# Patient Record
Sex: Female | Born: 1935 | Hispanic: Yes | Marital: Married | Smoking: Never smoker
Health system: Southern US, Community
[De-identification: ages and names within clinical notes are randomized; demographics above are authoritative.]

## PROBLEM LIST (undated history)

## (undated) DIAGNOSIS — E785 Hyperlipidemia, unspecified: Secondary | ICD-10-CM

## (undated) DIAGNOSIS — I10 Essential (primary) hypertension: Secondary | ICD-10-CM

---

## 2005-07-20 ENCOUNTER — Ambulatory Visit: Payer: Self-pay

## 2006-10-24 ENCOUNTER — Ambulatory Visit: Payer: Self-pay | Admitting: Unknown Physician Specialty

## 2007-11-13 ENCOUNTER — Ambulatory Visit: Payer: Self-pay | Admitting: Family Medicine

## 2008-11-11 ENCOUNTER — Ambulatory Visit: Payer: Self-pay | Admitting: Family Medicine

## 2010-01-05 ENCOUNTER — Ambulatory Visit: Payer: Self-pay | Admitting: Family Medicine

## 2013-10-20 ENCOUNTER — Ambulatory Visit: Payer: Self-pay | Admitting: Urology

## 2013-11-09 ENCOUNTER — Ambulatory Visit: Payer: Self-pay | Admitting: Family Medicine

## 2014-04-27 ENCOUNTER — Encounter (HOSPITAL_COMMUNITY): Payer: Self-pay | Admitting: Emergency Medicine

## 2014-04-27 ENCOUNTER — Inpatient Hospital Stay (HOSPITAL_COMMUNITY)
Admission: EM | Admit: 2014-04-27 | Discharge: 2014-05-01 | DRG: 418 | Disposition: A | Discharge status: Home / Self Care | Payer: Medicaid Other | Attending: Internal Medicine | Admitting: Internal Medicine

## 2014-04-27 ENCOUNTER — Emergency Department (HOSPITAL_COMMUNITY): Payer: Medicaid Other

## 2014-04-27 DIAGNOSIS — K8060 Calculus of gallbladder and bile duct with cholecystitis, unspecified, without obstruction: Secondary | ICD-10-CM | POA: Diagnosis present

## 2014-04-27 DIAGNOSIS — E785 Hyperlipidemia, unspecified: Secondary | ICD-10-CM | POA: Diagnosis present

## 2014-04-27 DIAGNOSIS — K851 Biliary acute pancreatitis without necrosis or infection: Secondary | ICD-10-CM

## 2014-04-27 DIAGNOSIS — M129 Arthropathy, unspecified: Secondary | ICD-10-CM | POA: Diagnosis present

## 2014-04-27 DIAGNOSIS — R112 Nausea with vomiting, unspecified: Secondary | ICD-10-CM

## 2014-04-27 DIAGNOSIS — K810 Acute cholecystitis: Secondary | ICD-10-CM

## 2014-04-27 DIAGNOSIS — K859 Acute pancreatitis without necrosis or infection, unspecified: Principal | ICD-10-CM | POA: Diagnosis present

## 2014-04-27 DIAGNOSIS — I10 Essential (primary) hypertension: Secondary | ICD-10-CM | POA: Diagnosis present

## 2014-04-27 DIAGNOSIS — Z79899 Other long term (current) drug therapy: Secondary | ICD-10-CM

## 2014-04-27 DIAGNOSIS — D649 Anemia, unspecified: Secondary | ICD-10-CM | POA: Diagnosis present

## 2014-04-27 DIAGNOSIS — R1013 Epigastric pain: Secondary | ICD-10-CM

## 2014-04-27 DIAGNOSIS — K8064 Calculus of gallbladder and bile duct with chronic cholecystitis without obstruction: Secondary | ICD-10-CM | POA: Diagnosis present

## 2014-04-27 HISTORY — DX: Hyperlipidemia, unspecified: E78.5

## 2014-04-27 HISTORY — DX: Essential (primary) hypertension: I10

## 2014-04-27 MED ORDER — GI COCKTAIL ~~LOC~~
30.0000 mL | Freq: Once | ORAL | Status: AC
  Administered 2014-04-27: 30 mL via ORAL
  Filled 2014-04-27: qty 30

## 2014-04-27 NOTE — ED Notes (Signed)
Pt presents with LUQ pain since 1600. Vomited at home times 4, denies blood in vomit, denies diarrhea. Hx of GERD and gastric ulcers.

## 2014-04-27 NOTE — ED Notes (Signed)
Family at bedside. Patient does not speak [place]. Family member at bedside to translate. Patient states that she does not know her height or weight.

## 2014-04-27 NOTE — ED Provider Notes (Signed)
CSN: [id number]     Arrival date & time 04/27/14  2203 History  This chart was scribed for Vida Roller, MD by Milly Jakob, ED Scribe. The patient was seen in room APA12/APA12. Patient's care was started at 11:27 PM.    Chief Complaint  Patient presents with  . Abdominal Pain   The history is provided by the patient. The history is limited by a language barrier. A language interpreter was used (A relative).   HPI Comments: Caroline Maldonado is a 80 y.o. female with a history of HTN who presents to the Emergency Department complaining of acute onset, epigastric, abdominal pain that began 8 hours ago and radiates into her back. She reports that she has pain in her chest when she eats spicy food.  She reports associated cough and nausea. She reports dysuria. She denies She does not drink alcohol. She denies prior surgery. She has taken an NSAID regularly for the past 2 years for arthritis.   Past Medical History  Diagnosis Date  . Hypertension   . Hyperlipidemia    History reviewed. No pertinent past surgical history. History reviewed. No pertinent family history. History  Substance Use Topics  . Smoking status: Never Smoker   . Smokeless tobacco: Not on file  . Alcohol Use: No   OB History   Grav Para Term Preterm Abortions TAB SAB Ect Mult Living                 Review of Systems  Respiratory: Positive for cough.   Gastrointestinal: Positive for abdominal pain.  Genitourinary: Positive for dysuria.  All other systems reviewed and are negative.  Allergies  Review of patient's allergies indicates no known allergies.  Home Medications   Prior to Admission medications   Medication Sig Start Date End Date Taking? Authorizing Provider  acetaminophen (TYLENOL) 500 MG tablet Take 500 mg by mouth every 6 (six) hours as needed for mild pain or moderate pain.   Yes Historical Provider, MD  amLODipine (NORVASC) 10 MG tablet Take 10 mg by mouth daily.   Yes Historical Provider, MD   cholecalciferol (VITAMIN D) 400 UNITS TABS tablet Take 400 Units by mouth daily.   Yes Historical Provider, MD  clotrimazole (LOTRIMIN) 1 % cream Apply 1 application topically 2 (two) times daily.   Yes Historical Provider, MD  furosemide (LASIX) 40 MG tablet Take 40 mg by mouth daily.   Yes Historical Provider, MD  hydrocortisone 2.5 % cream Apply 1 application topically 2 (two) times daily.   Yes Historical Provider, MD  hydrOXYzine (ATARAX/VISTARIL) 10 MG tablet Take 10 mg by mouth 3 (three) times daily as needed.   Yes Historical Provider, MD  lisinopril (PRINIVIL,ZESTRIL) 40 MG tablet Take 40 mg by mouth daily.   Yes Historical Provider, MD  meloxicam (MOBIC) 7.5 MG tablet Take 7.5 mg by mouth every 12 (twelve) hours.   Yes Historical Provider, MD  miconazole (MICOTIN) 2 % cream Apply 1 application topically 2 (two) times daily.   Yes Historical Provider, MD  pantoprazole (PROTONIX) 40 MG tablet Take 40 mg by mouth daily.   Yes Historical Provider, MD  traMADol (ULTRAM) 50 MG tablet Take 50 mg by mouth every 6 (six) hours as needed for moderate pain.   Yes Historical Provider, MD   Triage Vitals: BP 131/57  Pulse 56  Temp(Src) 97.9 F (36.6 C) (Oral)  Resp 14  SpO2 100% Physical Exam  Nursing note and vitals reviewed. Constitutional: She appears well-developed and  well-nourished. No distress.  HENT:  Head: Normocephalic and atraumatic.  Mouth/Throat: Oropharynx is clear and moist. No oropharyngeal exudate.  Eyes: Conjunctivae and EOM are normal. Pupils are equal, round, and reactive to light. Right eye exhibits no discharge. Left eye exhibits no discharge. No scleral icterus.  Neck: Normal range of motion. Neck supple. No JVD present. No thyromegaly present.  Cardiovascular: Regular rhythm, normal heart sounds and intact distal pulses.  Tachycardia present.  Exam reveals no gallop and no friction rub.   No murmur heard. Sinus Tach 105, no murmur  Pulmonary/Chest: Effort normal and  breath sounds normal. No respiratory distress. She has no wheezes. She has no rales.  Abdominal: Soft. Bowel sounds are normal. She exhibits no distension and no mass. There is no tenderness.  No tympanitic sounds to percussion. No peritoneal signs. No edema.  Musculoskeletal: Normal range of motion. She exhibits no edema and no tenderness.  Lymphadenopathy:    She has no cervical adenopathy.  Neurological: She is alert. Coordination normal.  Bilateral hand tremor. Follows commands. No obvious facal droop.   Skin: Skin is warm and dry. No rash noted. No erythema.  Psychiatric: She has a normal mood and affect. Her behavior is normal.    ED Course  Procedures (including critical care time) DIAGNOSTIC STUDIES: Oxygen Saturation is 97% on room air, normal by my interpretation.    COORDINATION OF CARE: 11:37 PM-Discussed treatment plan with pt at bedside and pt agreed to plan.   Labs Review Labs Reviewed  LIPASE, BLOOD - Abnormal; Notable for the following:    Lipase >3000 (*)    All other components within normal limits  CBC WITH DIFFERENTIAL - Abnormal; Notable for the following:    Neutrophils Relative % 96 (*)    Neutro Abs 8.7 (*)    Lymphocytes Relative 3 (*)    Lymphs Abs 0.3 (*)    Monocytes Relative 1 (*)    All other components within normal limits  COMPREHENSIVE METABOLIC PANEL - Abnormal; Notable for the following:    Glucose, Bld 182 (*)    BUN 27 (*)    AST 622 (*)    ALT 369 (*)    Alkaline Phosphatase 209 (*)    Total Bilirubin 1.3 (*)    GFR calc non Af Amer 64 (*)    GFR calc Af Amer 75 (*)    Anion gap 21 (*)    All other components within normal limits  URINALYSIS, ROUTINE W REFLEX MICROSCOPIC - Abnormal; Notable for the following:    Hgb urine dipstick MODERATE (*)    Protein, ur 30 (*)    All other components within normal limits  URINE MICROSCOPIC-ADD ON - Abnormal; Notable for the following:    Squamous Epithelial / LPF MANY (*)    Bacteria, UA  MANY (*)    Casts HYALINE CASTS (*)    All other components within normal limits    Imaging Review Dg Chest 2 View  04/28/2014   CLINICAL DATA:  Shortness of breath.  Nonsmoker.  EXAM: CHEST  2 VIEW  COMPARISON:  None.  FINDINGS: Shallow inspiration. Atelectasis in the left lung base. The heart size and mediastinal contours are within normal limits. Both lungs are clear. The visualized skeletal structures are unremarkable.  IMPRESSION: Atelectasis in the left lung base.   Electronically Signed   By: Burman Nieves M.D.   On: 04/28/2014 00:04   Ct Abdomen Pelvis W Contrast  04/28/2014   CLINICAL DATA:  Abdominal pain.  Nausea.  EXAM: CT ABDOMEN AND PELVIS WITH CONTRAST  TECHNIQUE: Multidetector CT imaging of the abdomen and pelvis was performed using the standard protocol following bolus administration of intravenous contrast.  CONTRAST:  50mL OMNIPAQUE IOHEXOL 300 MG/ML SOLN, OMNIPAQUE IOHEXOL 300 MG/ML SOLN  COMPARISON:  10/20/2013.  FINDINGS: Small paraesophageal hiatal hernia. Reflux contrast in the esophagus. Mild diffuse low density of the liver relative to the spleen. A small cyst in the periphery of the right lobe of the liver is unchanged. Small calcified granulomata in the liver. Small bilateral renal cysts. The intrahepatic and extrahepatic bile ducts remain prominent. There interval minimal pericholecystic edema. No visible gallstones.  Unremarkable spleen, pancreas, adrenal glands, urinary bladder, uterus and ovaries. No intestinal abnormalities. Normal appearing appendix. No enlarged lymph nodes. Left femoral hernia containing herniated small bowel without obstruction or wall thickening.  Linear atelectasis or scarring at both lung bases. Prominent vessels of both lung bases. Lumbar and lower thoracic spine degenerative changes. These include facet degenerative changes in the lower lumbar spine with associated grade 1 anterolisthesis at the L4-5 and L5-S1 levels. No pars defects are  seen.  IMPRESSION: 1. Interval mild pericholecystic edema, suggesting the possibility of acute cholecystitis. If this is a clinical concern, this could be further evaluated with abdomen ultrasound. 2. Mild diffuse hepatic steatosis. 3. Left femoral hernia containing herniated small bowel without obstruction. 4. Pulmonary vascular congestion   Electronically Signed   By: Gordan Payment M.D.   On: 04/28/2014 02:42      MDM   Final diagnoses:  Acute biliary pancreatitis    This lab workup as well as transaminitis, ultrasound not available at this hour the CT scan was ordered showing some pericholecystic fluid and a distended gallbladder. This case was discussed with the hospitalist to admit to the hospital. The patient's symptoms are improved with medications, vital signs remained stable. She does not have a leukocytosis. Medications ordered as below   Meds given in ED:  Medications  Ampicillin-Sulbactam (UNASYN) 3 g in sodium chloride 0.9 % 100 mL IVPB (not administered)  sodium chloride 0.9 % bolus 1,000 mL (not administered)  gi cocktail (Maalox,Lidocaine,Donnatal) (30 mLs Oral Given 04/27/14 2358)  0.9 %  sodium chloride infusion ( Intravenous New Bag/Given 04/28/14 0104)  iohexol (OMNIPAQUE) 300 MG/ML solution 50 mL (50 mLs Oral Contrast Given 04/28/14 0131)  iohexol (OMNIPAQUE) 300 MG/ML solution 100 mL (100 mLs Intravenous Contrast Given 04/28/14 0214)  HYDROmorphone (DILAUDID) injection 1 mg (1 mg Intravenous Given 04/28/14 0154)  ondansetron (ZOFRAN) injection 4 mg (4 mg Intravenous Given 04/28/14 0154)    New Prescriptions   No medications on file     I personally performed the services described in this documentation, which was scribed in my presence. The recorded information has been reviewed and is accurate.      Vida Roller, MD 04/28/14 [phone]

## 2014-04-28 ENCOUNTER — Emergency Department (HOSPITAL_COMMUNITY): Payer: Medicaid Other

## 2014-04-28 ENCOUNTER — Inpatient Hospital Stay (HOSPITAL_COMMUNITY): Payer: Medicaid Other

## 2014-04-28 ENCOUNTER — Encounter (HOSPITAL_COMMUNITY): Payer: Self-pay | Admitting: *Deleted

## 2014-04-28 DIAGNOSIS — E785 Hyperlipidemia, unspecified: Secondary | ICD-10-CM | POA: Diagnosis present

## 2014-04-28 DIAGNOSIS — K859 Acute pancreatitis without necrosis or infection, unspecified: Principal | ICD-10-CM

## 2014-04-28 DIAGNOSIS — I10 Essential (primary) hypertension: Secondary | ICD-10-CM

## 2014-04-28 DIAGNOSIS — R112 Nausea with vomiting, unspecified: Secondary | ICD-10-CM | POA: Diagnosis present

## 2014-04-28 DIAGNOSIS — Z79899 Other long term (current) drug therapy: Secondary | ICD-10-CM | POA: Diagnosis not present

## 2014-04-28 DIAGNOSIS — K8064 Calculus of gallbladder and bile duct with chronic cholecystitis without obstruction: Secondary | ICD-10-CM | POA: Diagnosis present

## 2014-04-28 DIAGNOSIS — R1013 Epigastric pain: Secondary | ICD-10-CM | POA: Diagnosis present

## 2014-04-28 DIAGNOSIS — K8020 Calculus of gallbladder without cholecystitis without obstruction: Secondary | ICD-10-CM

## 2014-04-28 DIAGNOSIS — K8060 Calculus of gallbladder and bile duct with cholecystitis, unspecified, without obstruction: Secondary | ICD-10-CM | POA: Diagnosis present

## 2014-04-28 DIAGNOSIS — D649 Anemia, unspecified: Secondary | ICD-10-CM | POA: Diagnosis present

## 2014-04-28 DIAGNOSIS — M129 Arthropathy, unspecified: Secondary | ICD-10-CM | POA: Diagnosis present

## 2014-04-28 DIAGNOSIS — K851 Biliary acute pancreatitis without necrosis or infection: Secondary | ICD-10-CM | POA: Diagnosis present

## 2014-04-28 LAB — CBC WITH DIFFERENTIAL/PLATELET
Basophils Absolute: 0 K/uL (ref 0.0–0.1)
Basophils Relative: 0 % (ref 0–1)
Eosinophils Absolute: 0 K/uL (ref 0.0–0.7)
Eosinophils Relative: 0 % (ref 0–5)
HCT: 36.9 % (ref 36.0–46.0)
Hemoglobin: 12.2 g/dL (ref 12.0–15.0)
Lymphocytes Relative: 3 % — ABNORMAL LOW (ref 12–46)
Lymphs Abs: 0.3 K/uL — ABNORMAL LOW (ref 0.7–4.0)
MCH: 26.8 pg (ref 26.0–34.0)
MCHC: 33.1 g/dL (ref 30.0–36.0)
MCV: 80.9 fL (ref 78.0–100.0)
Monocytes Absolute: 0.1 K/uL (ref 0.1–1.0)
Monocytes Relative: 1 % — ABNORMAL LOW (ref 3–12)
Neutro Abs: 8.7 K/uL — ABNORMAL HIGH (ref 1.7–7.7)
Neutrophils Relative %: 96 % — ABNORMAL HIGH (ref 43–77)
Platelets: 188 K/uL (ref 150–400)
RBC: 4.56 MIL/uL (ref 3.87–5.11)
RDW: 14.7 % (ref 11.5–15.5)
WBC: 9.1 K/uL (ref 4.0–10.5)

## 2014-04-28 LAB — COMPREHENSIVE METABOLIC PANEL WITH GFR
ALT: 369 U/L — ABNORMAL HIGH (ref 0–35)
AST: 622 U/L — ABNORMAL HIGH (ref 0–37)
Albumin: 4 g/dL (ref 3.5–5.2)
Alkaline Phosphatase: 209 U/L — ABNORMAL HIGH (ref 39–117)
Anion gap: 21 — ABNORMAL HIGH (ref 5–15)
BUN: 27 mg/dL — ABNORMAL HIGH (ref 6–23)
CO2: 21 meq/L (ref 19–32)
Calcium: 9.2 mg/dL (ref 8.4–10.5)
Chloride: 99 meq/L (ref 96–112)
Creatinine, Ser: 0.85 mg/dL (ref 0.50–1.10)
GFR calc Af Amer: 75 mL/min — ABNORMAL LOW
GFR calc non Af Amer: 64 mL/min — ABNORMAL LOW
Glucose, Bld: 182 mg/dL — ABNORMAL HIGH (ref 70–99)
Potassium: 3.8 meq/L (ref 3.7–5.3)
Sodium: 141 meq/L (ref 137–147)
Total Bilirubin: 1.3 mg/dL — ABNORMAL HIGH (ref 0.3–1.2)
Total Protein: 8.2 g/dL (ref 6.0–8.3)

## 2014-04-28 LAB — COMPREHENSIVE METABOLIC PANEL
ALT: 375 U/L — AB (ref 0–35)
AST: 525 U/L — ABNORMAL HIGH (ref 0–37)
Albumin: 3.6 g/dL (ref 3.5–5.2)
Alkaline Phosphatase: 195 U/L — ABNORMAL HIGH (ref 39–117)
Anion gap: 15 (ref 5–15)
BUN: 28 mg/dL — ABNORMAL HIGH (ref 6–23)
CO2: 25 meq/L (ref 19–32)
Calcium: 8.7 mg/dL (ref 8.4–10.5)
Chloride: 102 mEq/L (ref 96–112)
Creatinine, Ser: 1.08 mg/dL (ref 0.50–1.10)
GFR calc Af Amer: 56 mL/min — ABNORMAL LOW (ref >90)
GFR calc non Af Amer: 48 mL/min — ABNORMAL LOW (ref >90)
GLUCOSE: 162 mg/dL — AB (ref 70–99)
POTASSIUM: 3.6 meq/L — AB (ref 3.7–5.3)
SODIUM: 142 meq/L (ref 137–147)
Total Bilirubin: 1.8 mg/dL — ABNORMAL HIGH (ref 0.3–1.2)
Total Protein: 7.2 g/dL (ref 6.0–8.3)

## 2014-04-28 LAB — URINALYSIS, ROUTINE W REFLEX MICROSCOPIC
Bilirubin Urine: NEGATIVE
Glucose, UA: NEGATIVE mg/dL
Ketones, ur: NEGATIVE mg/dL
Leukocytes, UA: NEGATIVE
Nitrite: NEGATIVE
Protein, ur: 30 mg/dL — AB
Specific Gravity, Urine: 1.01 (ref 1.005–1.030)
Urobilinogen, UA: 0.2 mg/dL (ref 0.0–1.0)
pH: 6 (ref 5.0–8.0)

## 2014-04-28 LAB — CBC
HEMATOCRIT: 33.7 % — AB (ref 36.0–46.0)
HEMOGLOBIN: 11 g/dL — AB (ref 12.0–15.0)
MCH: 26.4 pg (ref 26.0–34.0)
MCHC: 32.6 g/dL (ref 30.0–36.0)
MCV: 80.8 fL (ref 78.0–100.0)
Platelets: 206 10*3/uL (ref 150–400)
RBC: 4.17 MIL/uL (ref 3.87–5.11)
RDW: 15 % (ref 11.5–15.5)
WBC: 19.6 10*3/uL — AB (ref 4.0–10.5)

## 2014-04-28 LAB — PROTIME-INR
INR: 1.04 (ref 0.00–1.49)
Prothrombin Time: 13.6 seconds (ref 11.6–15.2)

## 2014-04-28 LAB — URINE MICROSCOPIC-ADD ON

## 2014-04-28 LAB — LIPASE, BLOOD
LIPASE: 895 U/L — AB (ref 11–59)
Lipase: >3000 U/L — ABNORMAL HIGH (ref 11–59)

## 2014-04-28 MED ORDER — IOHEXOL 300 MG/ML  SOLN
100.0000 mL | Freq: Once | INTRAMUSCULAR | Status: AC | PRN
  Administered 2014-04-28: 100 mL via INTRAVENOUS

## 2014-04-28 MED ORDER — SODIUM CHLORIDE 0.9 % IV SOLN
INTRAVENOUS | Status: AC
  Administered 2014-04-28 (×2): via INTRAVENOUS

## 2014-04-28 MED ORDER — ONDANSETRON HCL 4 MG/2ML IJ SOLN
4.0000 mg | Freq: Once | INTRAMUSCULAR | Status: AC
  Administered 2014-04-28: 4 mg via INTRAVENOUS
  Filled 2014-04-28: qty 2

## 2014-04-28 MED ORDER — SODIUM CHLORIDE 0.9 % IV SOLN
INTRAVENOUS | Status: AC
  Administered 2014-04-28: 04:00:00 via INTRAVENOUS

## 2014-04-28 MED ORDER — AMPICILLIN-SULBACTAM SODIUM 1.5 (1-0.5) G IJ SOLR
1.5000 g | Freq: Four times a day (QID) | INTRAMUSCULAR | Status: DC
  Administered 2014-04-28 – 2014-05-01 (×13): 1.5 g via INTRAVENOUS
  Filled 2014-04-28 (×21): qty 1.5

## 2014-04-28 MED ORDER — SODIUM CHLORIDE 0.9 % IV SOLN
3.0000 g | Freq: Once | INTRAVENOUS | Status: AC
  Administered 2014-04-28: 3 g via INTRAVENOUS
  Filled 2014-04-28: qty 3

## 2014-04-28 MED ORDER — SODIUM CHLORIDE 0.9 % IV BOLUS (SEPSIS)
1000.0000 mL | Freq: Once | INTRAVENOUS | Status: AC
  Administered 2014-04-28: 1000 mL via INTRAVENOUS

## 2014-04-28 MED ORDER — HYDROMORPHONE HCL PF 1 MG/ML IJ SOLN: 1.0000 mg | INTRAMUSCULAR | Status: AC | PRN

## 2014-04-28 MED ORDER — SODIUM CHLORIDE 0.9 % IV SOLN: INTRAVENOUS | Status: DC

## 2014-04-28 MED ORDER — ONDANSETRON HCL 4 MG/2ML IJ SOLN: 4.0000 mg | Freq: Three times a day (TID) | INTRAMUSCULAR | Status: AC | PRN

## 2014-04-28 MED ORDER — HYDROMORPHONE HCL PF 1 MG/ML IJ SOLN
1.0000 mg | INTRAMUSCULAR | Status: AC
  Administered 2014-04-28: 1 mg via INTRAVENOUS
  Filled 2014-04-28: qty 1

## 2014-04-28 MED ORDER — IOHEXOL 300 MG/ML  SOLN
50.0000 mL | Freq: Once | INTRAMUSCULAR | Status: AC | PRN
  Administered 2014-04-28: 50 mL via ORAL

## 2014-04-28 MED ORDER — POTASSIUM CHLORIDE IN NACL 20-0.9 MEQ/L-% IV SOLN
INTRAVENOUS | Status: DC
  Administered 2014-04-28 – 2014-04-30 (×4): via INTRAVENOUS

## 2014-04-28 MED ORDER — SODIUM CHLORIDE 0.9 % IV SOLN
Freq: Once | INTRAVENOUS | Status: AC
  Administered 2014-04-28: 01:00:00 via INTRAVENOUS

## 2014-04-28 MED ORDER — HYDROMORPHONE HCL PF 1 MG/ML IJ SOLN
1.0000 mg | INTRAMUSCULAR | Status: DC | PRN
  Administered 2014-04-29 – 2014-05-01 (×6): 1 mg via INTRAVENOUS
  Filled 2014-04-28 (×6): qty 1

## 2014-04-28 MED ORDER — ONDANSETRON HCL 4 MG PO TABS: 4.0000 mg | ORAL_TABLET | Freq: Four times a day (QID) | ORAL | Status: DC | PRN

## 2014-04-28 MED ORDER — ONDANSETRON HCL 4 MG/2ML IJ SOLN: 4.0000 mg | Freq: Four times a day (QID) | INTRAMUSCULAR | Status: DC | PRN

## 2014-04-28 NOTE — Progress Notes (Signed)
ANTIBIOTIC CONSULT NOTE - INITIAL  Pharmacy Consult for Unasyn Indication: intra-abdominal infection  No Known Allergies  Patient Measurements: Height:  (165.1 cm) Weight: 159 lb 9.8 oz (72.4 kg) IBW/kg (Calculated) : 57  Vital Signs: Temp: 99 F (37.2 C) (08/26 0339) Temp src: Oral (08/26 0339) BP: 104/60 mmHg (08/26 0339) Pulse Rate: 75 (08/26 0339) Intake/Output from previous day:   Intake/Output from this shift:    Labs:  Recent Labs  04/28/14 0001 04/28/14 0501  WBC 9.1 19.6*  HGB 12.2 11.0*  PLT 188 206  CREATININE 0.85 1.08   Estimated Creatinine Clearance: 43.5 ml/min (by C-G formula based on Cr of 1.08). No results found for this basename: VANCOTROUGH, VANCOPEAK, VANCORANDOM, GENTTROUGH, GENTPEAK, GENTRANDOM, TOBRATROUGH, TOBRAPEAK, TOBRARND, AMIKACINPEAK, AMIKACINTROU, AMIKACIN,  in the last 72 hours   Microbiology: No results found for this or any previous visit (from the past 720 hour(s)).  Medical History: Past Medical History  Diagnosis Date  . Hypertension   . Hyperlipidemia    Unasyn 8/26>>  Assessment: 80yo female admitted with c/o epigastric abdominal pain with n/v.  Asked to initiate Unasyn for suspected infection.  Estimated Creatinine Clearance: 43.5 ml/min (by C-G formula based on Cr of 1.08).  Pt received Unasyn 3gm IV on admission.  Goal of Therapy:  Eradicate infection.  Plan:  Unasyn 1.5gm IV q6hrs Monitor labs, renal fxn, and cultures  Margo Aye, Analysa Nutting A 04/28/2014,7:36 AM

## 2014-04-28 NOTE — Progress Notes (Signed)
Patient admitted to the hospital earlier this morning by Dr. Onalee Hua  Patient seen and examined.  She has been admitted to the hospital with biliary pancreatitis. Serum lipase and liver enzymes are elevated, but are trending down. Will continue with supportive care for now. General surgery is following for possible cholecystectomy.  She is on antibiotics.  Continue clear liquids for now.  Kylee Umana

## 2014-04-28 NOTE — Consult Note (Signed)
Reason for Consult: Gallstone pancreatitis Referring Physician: Hospitalist  Caroline Maldonado is an 80 y.o. female.  HPI: Patient is a 80 year old Hispanic female who was admitted to the hospital the diagnosis of gallstone pancreatitis. History is limited as the patient speaks no [place]. I did give her history from the chart. She currently has epigastric pain, which is improved with pain medication.  Past Medical History  Diagnosis Date  . Hypertension   . Hyperlipidemia     History reviewed. No pertinent past surgical history.  History reviewed. No pertinent family history.  Social History:  reports that she has never smoked. She does not have any smokeless tobacco history on file. She reports that she does not drink alcohol or use illicit drugs.  Allergies: No Known Allergies  Medications: I have reviewed the patient's current medications.  Results for orders placed during the hospital encounter of 04/27/14 (from the past 48 hour(s))  LIPASE, BLOOD     Status: Abnormal   Collection Time    04/28/14 12:01 AM      Result Value Ref Range   Lipase >3000 (*) 11 - 59 U/L   Comment: RESULT REPEATED AND VERIFIED  CBC WITH DIFFERENTIAL     Status: Abnormal   Collection Time    04/28/14 12:01 AM      Result Value Ref Range   WBC 9.1  4.0 - 10.5 K/uL   RBC 4.56  3.87 - 5.11 MIL/uL   Hemoglobin 12.2  12.0 - 15.0 g/dL   HCT 36.9  36.0 - 46.0 %   MCV 80.9  78.0 - 100.0 fL   MCH 26.8  26.0 - 34.0 pg   MCHC 33.1  30.0 - 36.0 g/dL   RDW 14.7  11.5 - 15.5 %   Platelets 188  150 - 400 K/uL   Neutrophils Relative % 96 (*) 43 - 77 %   Neutro Abs 8.7 (*) 1.7 - 7.7 K/uL   Lymphocytes Relative 3 (*) 12 - 46 %   Lymphs Abs 0.3 (*) 0.7 - 4.0 K/uL   Monocytes Relative 1 (*) 3 - 12 %   Monocytes Absolute 0.1  0.1 - 1.0 K/uL   Eosinophils Relative 0  0 - 5 %   Eosinophils Absolute 0.0  0.0 - 0.7 K/uL   Basophils Relative 0  0 - 1 %   Basophils Absolute 0.0  0.0 - 0.1 K/uL  COMPREHENSIVE  METABOLIC PANEL     Status: Abnormal   Collection Time    04/28/14 12:01 AM      Result Value Ref Range   Sodium 141  137 - 147 mEq/L   Potassium 3.8  3.7 - 5.3 mEq/L   Chloride 99  96 - 112 mEq/L   CO2 21  19 - 32 mEq/L   Glucose, Bld 182 (*) 70 - 99 mg/dL   BUN 27 (*) 6 - 23 mg/dL   Creatinine, Ser 0.85  0.50 - 1.10 mg/dL   Calcium 9.2  8.4 - 10.5 mg/dL   Total Protein 8.2  6.0 - 8.3 g/dL   Albumin 4.0  3.5 - 5.2 g/dL   AST 622 (*) 0 - 37 U/L   ALT 369 (*) 0 - 35 U/L   Alkaline Phosphatase 209 (*) 39 - 117 U/L   Total Bilirubin 1.3 (*) 0.3 - 1.2 mg/dL   GFR calc non Af Amer 64 (*) >90 mL/min   GFR calc Af Amer 75 (*) >90 mL/min   Comment: (NOTE)  The eGFR has been calculated using the CKD EPI equation.     This calculation has not been validated in all clinical situations.     eGFR's persistently <90 mL/min signify possible Chronic Kidney     Disease.   Anion gap 21 (*) 5 - 15  URINALYSIS, ROUTINE W REFLEX MICROSCOPIC     Status: Abnormal   Collection Time    04/28/14 12:36 AM      Result Value Ref Range   Color, Urine YELLOW  YELLOW   APPearance CLEAR  CLEAR   Specific Gravity, Urine 1.010  1.005 - 1.030   pH 6.0  5.0 - 8.0   Glucose, UA NEGATIVE  NEGATIVE mg/dL   Hgb urine dipstick MODERATE (*) NEGATIVE   Bilirubin Urine NEGATIVE  NEGATIVE   Ketones, ur NEGATIVE  NEGATIVE mg/dL   Protein, ur 30 (*) NEGATIVE mg/dL   Urobilinogen, UA 0.2  0.0 - 1.0 mg/dL   Nitrite NEGATIVE  NEGATIVE   Leukocytes, UA NEGATIVE  NEGATIVE  URINE MICROSCOPIC-ADD ON     Status: Abnormal   Collection Time    04/28/14 12:36 AM      Result Value Ref Range   Squamous Epithelial / LPF MANY (*) RARE   WBC, UA 0-2  <3 WBC/hpf   RBC / HPF 3-6  <3 RBC/hpf   Bacteria, UA MANY (*) RARE   Casts HYALINE CASTS (*) NEGATIVE   Comment: GRANULAR CAST  COMPREHENSIVE METABOLIC PANEL     Status: Abnormal   Collection Time    04/28/14  5:01 AM      Result Value Ref Range   Sodium 142  137 - 147  mEq/L   Potassium 3.6 (*) 3.7 - 5.3 mEq/L   Chloride 102  96 - 112 mEq/L   CO2 25  19 - 32 mEq/L   Glucose, Bld 162 (*) 70 - 99 mg/dL   BUN 28 (*) 6 - 23 mg/dL   Creatinine, Ser 1.08  0.50 - 1.10 mg/dL   Calcium 8.7  8.4 - 10.5 mg/dL   Total Protein 7.2  6.0 - 8.3 g/dL   Albumin 3.6  3.5 - 5.2 g/dL   AST 525 (*) 0 - 37 U/L   ALT 375 (*) 0 - 35 U/L   Alkaline Phosphatase 195 (*) 39 - 117 U/L   Total Bilirubin 1.8 (*) 0.3 - 1.2 mg/dL   GFR calc non Af Amer 48 (*) >90 mL/min   GFR calc Af Amer 56 (*) >90 mL/min   Comment: (NOTE)     The eGFR has been calculated using the CKD EPI equation.     This calculation has not been validated in all clinical situations.     eGFR's persistently <90 mL/min signify possible Chronic Kidney     Disease.   Anion gap 15  5 - 15  CBC     Status: Abnormal   Collection Time    04/28/14  5:01 AM      Result Value Ref Range   WBC 19.6 (*) 4.0 - 10.5 K/uL   RBC 4.17  3.87 - 5.11 MIL/uL   Hemoglobin 11.0 (*) 12.0 - 15.0 g/dL   HCT 33.7 (*) 36.0 - 46.0 %   MCV 80.8  78.0 - 100.0 fL   MCH 26.4  26.0 - 34.0 pg   MCHC 32.6  30.0 - 36.0 g/dL   RDW 15.0  11.5 - 15.5 %   Platelets 206  150 - 400 K/uL  PROTIME-INR  Status: None   Collection Time    04/28/14  5:01 AM      Result Value Ref Range   Prothrombin Time 13.6  11.6 - 15.2 seconds   INR 1.04  0.00 - 1.49  LIPASE, BLOOD     Status: Abnormal   Collection Time    04/28/14  5:01 AM      Result Value Ref Range   Lipase 895 (*) 11 - 59 U/L    Dg Chest 2 View  04/28/2014   CLINICAL DATA:  Shortness of breath.  Nonsmoker.  EXAM: CHEST  2 VIEW  COMPARISON:  None.  FINDINGS: Shallow inspiration. Atelectasis in the left lung base. The heart size and mediastinal contours are within normal limits. Both lungs are clear. The visualized skeletal structures are unremarkable.  IMPRESSION: Atelectasis in the left lung base.   Electronically Signed   By: Lucienne Capers M.D.   On: 04/28/2014 00:04   Ct Abdomen  Pelvis W Contrast  04/28/2014   CLINICAL DATA:  Abdominal pain.  Nausea.  EXAM: CT ABDOMEN AND PELVIS WITH CONTRAST  TECHNIQUE: Multidetector CT imaging of the abdomen and pelvis was performed using the standard protocol following bolus administration of intravenous contrast.  CONTRAST:  62m OMNIPAQUE IOHEXOL 300 MG/ML SOLN, 1011mOMNIPAQUE IOHEXOL 300 MG/ML SOLN  COMPARISON:  10/20/2013.  FINDINGS: Small paraesophageal hiatal hernia. Reflux contrast in the esophagus. Mild diffuse low density of the liver relative to the spleen. A small cyst in the periphery of the right lobe of the liver is unchanged. Small calcified granulomata in the liver. Small bilateral renal cysts. The intrahepatic and extrahepatic bile ducts remain prominent. There interval minimal pericholecystic edema. No visible gallstones.  Unremarkable spleen, pancreas, adrenal glands, urinary bladder, uterus and ovaries. No intestinal abnormalities. Normal appearing appendix. No enlarged lymph nodes. Left femoral hernia containing herniated small bowel without obstruction or wall thickening.  Linear atelectasis or scarring at both lung bases. Prominent vessels of both lung bases. Lumbar and lower thoracic spine degenerative changes. These include facet degenerative changes in the lower lumbar spine with associated grade 1 anterolisthesis at the L4-5 and L5-S1 levels. No pars defects are seen.  IMPRESSION: 1. Interval mild pericholecystic edema, suggesting the possibility of acute cholecystitis. If this is a clinical concern, this could be further evaluated with abdomen ultrasound. 2. Mild diffuse hepatic steatosis. 3. Left femoral hernia containing herniated small bowel without obstruction. 4. Pulmonary vascular congestion   Electronically Signed   By: StEnrique Sack.D.   On: 04/28/2014 02:42    ROS: See chart Blood pressure 104/60, pulse 75, temperature 99 F (37.2 C), temperature source Oral, resp. rate 18, height _0  (1.651 m), weight 72.4  kg (159 lb 9.8 oz), SpO2 96.00%. Physical Exam: Pleasant Hispanic female in no acute distress. Abdomen soft with some tenderness on the right upper quadrant palpation. No hepatosplenomegaly, masses, or hernias identified. No rigidity is noted. Left femoral hernia reducible.  Assessment/Plan: Impression: Pancreatitis, possible gallstone pancreatitis Plan: We'll get ultrasound the gallbladder to further assess for cholelithiasis. If needed, laparoscopic cholecystectomy will be performed once patient's liver enzyme tests and pancreatic tests have normalized.  Shadonna Benedick A 04/28/2014, 12:24 PM

## 2014-04-28 NOTE — Progress Notes (Signed)
Inpatient Diabetes Program Recommendations  AACE/ADA: New Consensus Statement on Inpatient Glycemic Control (2013)  Target Ranges:  Prepandial:   less than 140 mg/dL      Peak postprandial:   less than 180 mg/dL (1-2 hours)      Critically ill patients:  140 - 180 mg/dL   Results for JEZEL, BASTO (MRN [id number]) as of 04/28/2014 09:02  Ref. Range 04/28/2014 00:01 04/28/2014 05:01  Glucose Latest Range: 70-99 mg/dL 782 (H) 956 (H)   Diabetes history: No Outpatient Diabetes medications: NA Current orders for Inpatient glycemic control: None  Inpatient Diabetes Program Recommendations Correction (SSI): Please consider ordering CBGs with Novolog sensitive correction scale ACHS.  HgbA1C: Please consider ordering an A1C to evaluate glycemic control over the past 2-3 months.  Thanks, Orlando Penner, RN, MSN, CCRN Diabetes Coordinator Inpatient Diabetes Program [phone] (Team Pager) [phone] ([facility] office) [phone] [facility] office)

## 2014-04-28 NOTE — Progress Notes (Signed)
UR review complete.  

## 2014-04-28 NOTE — H&P (Signed)
Chief Complaint:  abd pain  HPI: 80 yo healthy female comes in with sudden onset around 3pm of epigastric abd pain with associated n/v nonbloody, looked like milk (last drank some milk).  No fevers.  She still having epigastric abd pain but her vomiting has improved, she still has her gallbladder.  Has never had problems with her gallbladder or pancrease before.  No diarrhea.  No dysuria.  No back pain.    Review of Systems:  Positive and negative as per HPI otherwise all other systems are negative  Past Medical History: Past Medical History  Diagnosis Date  . Hypertension   . Hyperlipidemia    History reviewed. No pertinent past surgical history.  Medications: Prior to Admission medications   Medication Sig Start Date End Date Taking? Authorizing Provider  acetaminophen (TYLENOL) 500 MG tablet Take 500 mg by mouth every 6 (six) hours as needed for mild pain or moderate pain.   Yes Historical Provider, MD  amLODipine (NORVASC) 10 MG tablet Take 10 mg by mouth daily.   Yes Historical Provider, MD  cholecalciferol (VITAMIN D) 400 UNITS TABS tablet Take 400 Units by mouth daily.   Yes Historical Provider, MD  clotrimazole (LOTRIMIN) 1 % cream Apply 1 application topically 2 (two) times daily.   Yes Historical Provider, MD  furosemide (LASIX) 40 MG tablet Take 40 mg by mouth daily.   Yes Historical Provider, MD  hydrocortisone 2.5 % cream Apply 1 application topically 2 (two) times daily.   Yes Historical Provider, MD  hydrOXYzine (ATARAX/VISTARIL) 10 MG tablet Take 10 mg by mouth 3 (three) times daily as needed.   Yes Historical Provider, MD  lisinopril (PRINIVIL,ZESTRIL) 40 MG tablet Take 40 mg by mouth daily.   Yes Historical Provider, MD  meloxicam (MOBIC) 7.5 MG tablet Take 7.5 mg by mouth every 12 (twelve) hours.   Yes Historical Provider, MD  miconazole (MICOTIN) 2 % cream Apply 1 application topically 2 (two) times daily.   Yes Historical Provider, MD  pantoprazole (PROTONIX) 40  MG tablet Take 40 mg by mouth daily.   Yes Historical Provider, MD  traMADol (ULTRAM) 50 MG tablet Take 50 mg by mouth every 6 (six) hours as needed for moderate pain.   Yes Historical Provider, MD    Allergies:  No Known Allergies  Social History:  reports that she has never smoked. She does not have any smokeless tobacco history on file. She reports that she does not drink alcohol or use illicit drugs.  Family History: History reviewed. No pertinent family history.  Physical Exam: Filed Vitals:   04/27/14 2207 04/27/14 2230 04/27/14 2330 04/28/14 0130  BP: 114/53 131/57 175/82 120/55  Pulse: 55 56 103 89  Temp: 97.9 F (36.6 C)     TempSrc: Oral     Resp: 14     SpO2: 100% 100% 99% 98%   General appearance: alert, cooperative and no distress Head: Normocephalic, without obvious abnormality, atraumatic Eyes: negative Nose: Nares normal. Septum midline. Mucosa normal. No drainage or sinus tenderness. Neck: no JVD and supple, symmetrical, trachea midline Lungs: clear to auscultation bilaterally Heart: regular rate and rhythm, S1, S2 normal, no murmur, click, rub or gallop Abdomen: soft, non-tender; bowel sounds normal; no masses,  no organomegaly  Mild ttp epigastrim Extremities: extremities normal, atraumatic, no cyanosis or edema Pulses: 2+ and symmetric Skin: Skin color, texture, turgor normal. No rashes or lesions Neurologic: Grossly normal    Labs on Admission:   Recent Labs  04/28/14 0001  NA 141  K 3.8  CL 99  CO2 21  GLUCOSE 182*  BUN 27*  CREATININE 0.85  CALCIUM 9.2    Recent Labs  04/28/14 0001  AST 622*  ALT 369*  ALKPHOS 209*  BILITOT 1.3*  PROT 8.2  ALBUMIN 4.0    Recent Labs  04/28/14 0001  LIPASE >3000*    Recent Labs  04/28/14 0001  WBC 9.1  NEUTROABS 8.7*  HGB 12.2  HCT 36.9  MCV 80.9  PLT 188   Radiological Exams on Admission: Dg Chest 2 View  04/28/2014   CLINICAL DATA:  Shortness of breath.  Nonsmoker.  EXAM: CHEST   2 VIEW  COMPARISON:  None.  FINDINGS: Shallow inspiration. Atelectasis in the left lung base. The heart size and mediastinal contours are within normal limits. Both lungs are clear. The visualized skeletal structures are unremarkable.  IMPRESSION: Atelectasis in the left lung base.   Electronically Signed   By: Lucienne Capers M.D.   On: 04/28/2014 00:04   Ct Abdomen Pelvis W Contrast  04/28/2014   CLINICAL DATA:  Abdominal pain.  Nausea.  EXAM: CT ABDOMEN AND PELVIS WITH CONTRAST  TECHNIQUE: Multidetector CT imaging of the abdomen and pelvis was performed using the standard protocol following bolus administration of intravenous contrast.  CONTRAST:  80m OMNIPAQUE IOHEXOL 300 MG/ML SOLN, 1089mOMNIPAQUE IOHEXOL 300 MG/ML SOLN  COMPARISON:  10/20/2013.  FINDINGS: Small paraesophageal hiatal hernia. Reflux contrast in the esophagus. Mild diffuse low density of the liver relative to the spleen. A small cyst in the periphery of the right lobe of the liver is unchanged. Small calcified granulomata in the liver. Small bilateral renal cysts. The intrahepatic and extrahepatic bile ducts remain prominent. There interval minimal pericholecystic edema. No visible gallstones.  Unremarkable spleen, pancreas, adrenal glands, urinary bladder, uterus and ovaries. No intestinal abnormalities. Normal appearing appendix. No enlarged lymph nodes. Left femoral hernia containing herniated small bowel without obstruction or wall thickening.  Linear atelectasis or scarring at both lung bases. Prominent vessels of both lung bases. Lumbar and lower thoracic spine degenerative changes. These include facet degenerative changes in the lower lumbar spine with associated grade 1 anterolisthesis at the L4-5 and L5-S1 levels. No pars defects are seen.  IMPRESSION: 1. Interval mild pericholecystic edema, suggesting the possibility of acute cholecystitis. If this is a clinical concern, this could be further evaluated with abdomen ultrasound. 2.  Mild diffuse hepatic steatosis. 3. Left femoral hernia containing herniated small bowel without obstruction. 4. Pulmonary vascular congestion   Electronically Signed   By: StEnrique Sack.D.   On: 04/28/2014 02:42    Assessment/Plan  [age]o female with acute gallstone pancreatitis  Principal Problem:   Acute gallstone pancreatitis-  Ivf.  Npo.  Conservative treatment for now, obtain gi and general surgery consults in am for possible ercp, then cholecystecomy once her pancrease calms down.  Her lipase is well over 3000 with elevated lfts, alk phos and tbili.  abd exam is nonacute.  Cover with unasyn.  Hold anticoagulants until seen by gi and surg in case needs procedure in am.  Repeat cmp and lipase in am.  Active Problems: stable unless o/w noted   Hypertension   Hyperlipidemia   Abdominal pain, epigastric   Nausea and vomiting  Admit to med surg.  Full code.  Mainly spanish speaking, help provided by family members.  Kavin Weckwerth A 04/28/2014, 2:57 AM

## 2014-04-29 LAB — COMPREHENSIVE METABOLIC PANEL
ALT: 211 U/L — AB (ref 0–35)
AST: 150 U/L — AB (ref 0–37)
Albumin: 3 g/dL — ABNORMAL LOW (ref 3.5–5.2)
Alkaline Phosphatase: 150 U/L — ABNORMAL HIGH (ref 39–117)
Anion gap: 9 (ref 5–15)
BILIRUBIN TOTAL: 0.5 mg/dL (ref 0.3–1.2)
BUN: 11 mg/dL (ref 6–23)
CHLORIDE: 106 meq/L (ref 96–112)
CO2: 27 mEq/L (ref 19–32)
Calcium: 8.3 mg/dL — ABNORMAL LOW (ref 8.4–10.5)
Creatinine, Ser: 0.68 mg/dL (ref 0.50–1.10)
GFR calc Af Amer: >90 mL/min (ref >90)
GFR calc non Af Amer: 82 mL/min — ABNORMAL LOW (ref >90)
Glucose, Bld: 111 mg/dL — ABNORMAL HIGH (ref 70–99)
Potassium: 4 mEq/L (ref 3.7–5.3)
SODIUM: 142 meq/L (ref 137–147)
Total Protein: 6.4 g/dL (ref 6.0–8.3)

## 2014-04-29 LAB — CBC
HEMATOCRIT: 30.8 % — AB (ref 36.0–46.0)
HEMOGLOBIN: 9.8 g/dL — AB (ref 12.0–15.0)
MCH: 26.1 pg (ref 26.0–34.0)
MCHC: 31.8 g/dL (ref 30.0–36.0)
MCV: 82.1 fL (ref 78.0–100.0)
Platelets: 163 10*3/uL (ref 150–400)
RBC: 3.75 MIL/uL — ABNORMAL LOW (ref 3.87–5.11)
RDW: 15.3 % (ref 11.5–15.5)
WBC: 10.1 10*3/uL (ref 4.0–10.5)

## 2014-04-29 LAB — SURGICAL PCR SCREEN
MRSA, PCR: NEGATIVE
Staphylococcus aureus: NEGATIVE

## 2014-04-29 LAB — LIPASE, BLOOD: Lipase: 81 U/L — ABNORMAL HIGH (ref 11–59)

## 2014-04-29 MED ORDER — CHLORHEXIDINE GLUCONATE 4 % EX LIQD
1.0000 "application " | Freq: Once | CUTANEOUS | Status: DC
  Filled 2014-04-29: qty 15

## 2014-04-29 NOTE — Progress Notes (Addendum)
[facility] PROGRESS NOTE  Caroline Maldonado NWG:[id number] DOB: 20-Feb-1934 DOA: 04/27/2014 PCP: Default, Provider, MD  Assessment/Plan: 1. Acute gallstone pancreatitis. Lipase and liver enzymes appear to be improving. Clinically, her abdominal pain and vomiting is also improving. She's been seen by general surgery and plans are for laparoscopic cholecystectomy with intraoperative cholangiogram on 8/28. She's continued on Unasyn for now. 2. Hypertension. She is on multiple medications as an outpatient which have been held since admission. She is currently normotensive. 3. Anemia. Likely related to her acute illness. No evidence of bleeding. Continue to follow  Code Status: full code Family Communication: discussed with patient and family interpreted Disposition Plan: discharge home once improved   Consultants:  General surgery  Procedures:    Antibiotics:  unasyn 8/26  HPI/Subjective: Feeling better. Abdominal pain improving. No vomiting  Objective: Filed Vitals:   04/29/14 1440  BP: 126/55  Pulse: 66  Temp: 98.4 F (36.9 C)  Resp: 16    Intake/Output Summary (Last 24 hours) at 04/29/14 1915 Last data filed at 04/29/14 1842  Gross per 24 hour  Intake 3221.25 ml  Output      0 ml  Net 3221.25 ml   Filed Weights   04/28/14 0422 04/29/14 0504  Weight: 72.4 kg (159 lb 9.8 oz) 70.1 kg (154 lb 8.7 oz)    Exam:   General:  NAD  Cardiovascular: S1, S2 RRR  Respiratory: CTA B  Abdomen: soft, nt, nd, bs+  Musculoskeletal: no edema b/l   Data Reviewed: Basic Metabolic Panel:  Recent Labs Lab 04/28/14 0001 04/28/14 0501 04/29/14 0552  NA 141 142 142  K 3.8 3.6* 4.0  CL 99 102 106  CO2 GLUCOSE 182* 162* 111*  BUN 27* 28* 11  CREATININE 0.85 1.08 0.68  CALCIUM 9.2 8.7 8.3*   Liver Function Tests:  Recent Labs Lab 04/28/14 0001 04/28/14 0501 04/29/14 0552  AST 622* 525* 150*  ALT 369* 375* 211*  ALKPHOS 209* 195* 150*  BILITOT  1.3* 1.8* 0.5  PROT 8.2 7.2 6.4  ALBUMIN 4.0 3.6 3.0*    Recent Labs Lab 04/28/14 0001 04/28/14 0501 04/29/14 0552  LIPASE >3000* 895* 81*   No results found for this basename: AMMONIA,  in the last 168 hours CBC:  Recent Labs Lab 04/28/14 0001 04/28/14 0501 04/29/14 0552  WBC 9.1 19.6* 10.1  NEUTROABS 8.7*  --   --   HGB 12.2 11.0* 9.8*  HCT 36.9 33.7* 30.8*  MCV 80.9 80.8 82.1  PLT 188 206 163   Cardiac Enzymes: No results found for this basename: CKTOTAL, CKMB, CKMBINDEX, TROPONINI,  in the last 168 hours BNP (last 3 results) No results found for this basename: PROBNP,  in the last 8760 hours CBG: No results found for this basename: GLUCAP,  in the last 168 hours  No results found for this or any previous visit (from the past 240 hour(s)).   Studies: Dg Chest 2 View  04/28/2014   CLINICAL DATA:  Shortness of breath.  Nonsmoker.  EXAM: CHEST  2 VIEW  COMPARISON:  None.  FINDINGS: Shallow inspiration. Atelectasis in the left lung base. The heart size and mediastinal contours are within normal limits. Both lungs are clear. The visualized skeletal structures are unremarkable.  IMPRESSION: Atelectasis in the left lung base.   Electronically Signed   By: Burman Nieves M.D.   On: 04/28/2014 00:04   Ct Abdomen Pelvis W Contrast  04/28/2014   CLINICAL DATA:  Abdominal pain.  Nausea.  EXAM: CT ABDOMEN AND PELVIS WITH CONTRAST  TECHNIQUE: Multidetector CT imaging of the abdomen and pelvis was performed using the standard protocol following bolus administration of intravenous contrast.  CONTRAST:  50mL OMNIPAQUE IOHEXOL 300 MG/ML SOLN, OMNIPAQUE IOHEXOL 300 MG/ML SOLN  COMPARISON:  10/20/2013.  FINDINGS: Small paraesophageal hiatal hernia. Reflux contrast in the esophagus. Mild diffuse low density of the liver relative to the spleen. A small cyst in the periphery of the right lobe of the liver is unchanged. Small calcified granulomata in the liver. Small bilateral renal  cysts. The intrahepatic and extrahepatic bile ducts remain prominent. There interval minimal pericholecystic edema. No visible gallstones.  Unremarkable spleen, pancreas, adrenal glands, urinary bladder, uterus and ovaries. No intestinal abnormalities. Normal appearing appendix. No enlarged lymph nodes. Left femoral hernia containing herniated small bowel without obstruction or wall thickening.  Linear atelectasis or scarring at both lung bases. Prominent vessels of both lung bases. Lumbar and lower thoracic spine degenerative changes. These include facet degenerative changes in the lower lumbar spine with associated grade 1 anterolisthesis at the L4-5 and L5-S1 levels. No pars defects are seen.  IMPRESSION: 1. Interval mild pericholecystic edema, suggesting the possibility of acute cholecystitis. If this is a clinical concern, this could be further evaluated with abdomen ultrasound. 2. Mild diffuse hepatic steatosis. 3. Left femoral hernia containing herniated small bowel without obstruction. 4. Pulmonary vascular congestion   Electronically Signed   By: Gordan Payment M.D.   On: 04/28/2014 02:42   US Abdomen Limited  04/28/2014   CLINICAL DATA:  Right upper quadrant pain  EXAM: US ABDOMEN LIMITED - RIGHT UPPER QUADRANT  COMPARISON:  None.  FINDINGS: Gallbladder:  Large gallstone measuring 3 cm. No wall thickening or pericholecystic fluid is noted. There is echogenic material adjacent to the gallstone which may represent tumefactive sludge however mass cannot be totally excluded on the basis of this exam.  Common bile duct:  Diameter: 8.9 mm. This a mildly prominent even for the patient's given age. This corresponds with that seen on prior CT examination.  Liver:  No focal lesion identified. Within normal limits in parenchymal echogenicity.  IMPRESSION: Cholelithiasis. Echogenic material is noted adjacent to the gallstone which may represent sludge although the possibility of a mass could not be totally excluded.   Common bile duct dilatation.   Electronically Signed   By: Alcide Clever M.D.   On: 04/28/2014 13:23    Scheduled Meds: . ampicillin-sulbactam (UNASYN) IV  1.5 g Intravenous Q6H  . chlorhexidine  1 application Topical Once   Continuous Infusions: . 0.9 % NaCl with KCl 20 mEq / L 100 mL/hr at 04/29/14 1439    Principal Problem:   Acute gallstone pancreatitis Active Problems:   Hypertension   Hyperlipidemia   Abdominal pain, epigastric   Nausea and vomiting    Time spent:    [facility]  [facility] Pager [phone]. If 7PM-7AM, please contact night-coverage at [email or web address], password [facility] 04/29/2014, 7:15 PM  LOS: 2 days

## 2014-04-29 NOTE — Progress Notes (Signed)
I spoke with Dr. Kerry Hough. Patient is currently scheduled for lap chole with IOC for tomorrow. He wants to hold off no GI consult for now and will call us if needed.

## 2014-04-29 NOTE — Progress Notes (Signed)
Patient's liver enzyme tests and pancreatic enzymes have improved. Symptomatically, she is improved. Ultrasound the gallbladder reveals a large gallstone with sludge. Her common bile duct is dilated. Will proceed with a laparoscopic cholecystectomy with intraoperative cholangiograms tomorrow. The risks and benefits of the procedure including bleeding, infection, hepatobiliary injury, and the possibility of an open procedure were fully explained to the patient thru a family member as she does not speak [place], who gave informed consent.

## 2014-04-30 ENCOUNTER — Inpatient Hospital Stay (HOSPITAL_COMMUNITY): Payer: Medicaid Other | Admitting: Anesthesiology

## 2014-04-30 ENCOUNTER — Encounter (HOSPITAL_COMMUNITY)
Admission: EM | Disposition: A | Discharge status: Home / Self Care | Payer: Self-pay | Source: Home / Self Care | Attending: Internal Medicine

## 2014-04-30 ENCOUNTER — Encounter (HOSPITAL_COMMUNITY): Payer: Medicaid Other | Admitting: Anesthesiology

## 2014-04-30 ENCOUNTER — Inpatient Hospital Stay (HOSPITAL_COMMUNITY): Payer: Medicaid Other

## 2014-04-30 ENCOUNTER — Encounter (HOSPITAL_COMMUNITY): Payer: Self-pay | Admitting: General Surgery

## 2014-04-30 DIAGNOSIS — K810 Acute cholecystitis: Secondary | ICD-10-CM

## 2014-04-30 HISTORY — PX: CHOLECYSTECTOMY: SHX55

## 2014-04-30 LAB — CBC
HCT: 32.7 % — ABNORMAL LOW (ref 36.0–46.0)
Hemoglobin: 10.5 g/dL — ABNORMAL LOW (ref 12.0–15.0)
MCH: 26.5 pg (ref 26.0–34.0)
MCHC: 32.1 g/dL (ref 30.0–36.0)
MCV: 82.6 fL (ref 78.0–100.0)
Platelets: 177 10*3/uL (ref 150–400)
RBC: 3.96 MIL/uL (ref 3.87–5.11)
RDW: 15 % (ref 11.5–15.5)
WBC: 8.1 10*3/uL (ref 4.0–10.5)

## 2014-04-30 LAB — COMPREHENSIVE METABOLIC PANEL
ALK PHOS: 139 U/L — AB (ref 39–117)
ALT: 144 U/L — AB (ref 0–35)
ANION GAP: 10 (ref 5–15)
AST: 65 U/L — ABNORMAL HIGH (ref 0–37)
Albumin: 3.2 g/dL — ABNORMAL LOW (ref 3.5–5.2)
BILIRUBIN TOTAL: 0.3 mg/dL (ref 0.3–1.2)
BUN: 6 mg/dL (ref 6–23)
CHLORIDE: 103 meq/L (ref 96–112)
CO2: 29 mEq/L (ref 19–32)
Calcium: 8.8 mg/dL (ref 8.4–10.5)
Creatinine, Ser: 0.63 mg/dL (ref 0.50–1.10)
GFR calc Af Amer: >90 mL/min (ref >90)
GFR calc non Af Amer: 84 mL/min — ABNORMAL LOW (ref >90)
Glucose, Bld: 112 mg/dL — ABNORMAL HIGH (ref 70–99)
POTASSIUM: 4 meq/L (ref 3.7–5.3)
SODIUM: 142 meq/L (ref 137–147)
TOTAL PROTEIN: 7 g/dL (ref 6.0–8.3)

## 2014-04-30 LAB — LIPASE, BLOOD: Lipase: 50 U/L (ref 11–59)

## 2014-04-30 SURGERY — LAPAROSCOPIC CHOLECYSTECTOMY
Anesthesia: General

## 2014-04-30 MED ORDER — MIDAZOLAM HCL 2 MG/2ML IJ SOLN
INTRAMUSCULAR | Status: AC
  Filled 2014-04-30: qty 2

## 2014-04-30 MED ORDER — 0.9 % SODIUM CHLORIDE (POUR BTL) OPTIME
TOPICAL | Status: DC | PRN
  Administered 2014-04-30: 1000 mL

## 2014-04-30 MED ORDER — BUPIVACAINE HCL (PF) 0.5 % IJ SOLN
INTRAMUSCULAR | Status: AC
  Filled 2014-04-30: qty 30

## 2014-04-30 MED ORDER — ONDANSETRON HCL 4 MG/2ML IJ SOLN
INTRAMUSCULAR | Status: AC
  Filled 2014-04-30: qty 2

## 2014-04-30 MED ORDER — DEXAMETHASONE SODIUM PHOSPHATE 4 MG/ML IJ SOLN
INTRAMUSCULAR | Status: AC
  Filled 2014-04-30: qty 1

## 2014-04-30 MED ORDER — BUPIVACAINE HCL 0.5 % IJ SOLN
INTRAMUSCULAR | Status: DC | PRN
  Administered 2014-04-30: 10 mL

## 2014-04-30 MED ORDER — MIDAZOLAM HCL 5 MG/5ML IJ SOLN
INTRAMUSCULAR | Status: DC | PRN
  Administered 2014-04-30: 2 mg via INTRAVENOUS

## 2014-04-30 MED ORDER — PROPOFOL 10 MG/ML IV BOLUS
INTRAVENOUS | Status: DC | PRN
  Administered 2014-04-30: 110 mg via INTRAVENOUS

## 2014-04-30 MED ORDER — FENTANYL CITRATE 0.05 MG/ML IJ SOLN
25.0000 ug | INTRAMUSCULAR | Status: DC | PRN
  Filled 2014-04-30: qty 2

## 2014-04-30 MED ORDER — LISINOPRIL 10 MG PO TABS
40.0000 mg | ORAL_TABLET | Freq: Every day | ORAL | Status: DC
  Administered 2014-04-30: 40 mg via ORAL
  Filled 2014-04-30 (×2): qty 4

## 2014-04-30 MED ORDER — POVIDONE-IODINE 10 % EX OINT
TOPICAL_OINTMENT | CUTANEOUS | Status: AC
  Filled 2014-04-30: qty 1

## 2014-04-30 MED ORDER — LIDOCAINE HCL 1 % IJ SOLN
INTRAMUSCULAR | Status: DC | PRN
  Administered 2014-04-30: 40 mg via INTRADERMAL

## 2014-04-30 MED ORDER — LACTATED RINGERS IV SOLN
INTRAVENOUS | Status: DC
  Administered 2014-04-30: 1000 mL via INTRAVENOUS

## 2014-04-30 MED ORDER — PROPOFOL 10 MG/ML IV BOLUS
INTRAVENOUS | Status: AC
  Filled 2014-04-30: qty 20

## 2014-04-30 MED ORDER — GLYCOPYRROLATE 0.2 MG/ML IJ SOLN
INTRAMUSCULAR | Status: DC | PRN
  Administered 2014-04-30: 0.4 mg via INTRAVENOUS

## 2014-04-30 MED ORDER — ROCURONIUM BROMIDE 100 MG/10ML IV SOLN
INTRAVENOUS | Status: DC | PRN
  Administered 2014-04-30: 30 mg via INTRAVENOUS

## 2014-04-30 MED ORDER — ENOXAPARIN SODIUM 40 MG/0.4ML ~~LOC~~ SOLN
40.0000 mg | SUBCUTANEOUS | Status: DC
  Administered 2014-04-30: 40 mg via SUBCUTANEOUS
  Filled 2014-04-30: qty 0.4

## 2014-04-30 MED ORDER — LACTATED RINGERS IV SOLN
INTRAVENOUS | Status: DC
  Administered 2014-04-30: 14:00:00 via INTRAVENOUS

## 2014-04-30 MED ORDER — ONDANSETRON HCL 4 MG/2ML IJ SOLN
4.0000 mg | Freq: Once | INTRAMUSCULAR | Status: AC
  Administered 2014-04-30: 4 mg via INTRAVENOUS

## 2014-04-30 MED ORDER — LISINOPRIL 10 MG PO TABS
40.0000 mg | ORAL_TABLET | Freq: Every day | ORAL | Status: DC
  Administered 2014-04-30 – 2014-05-01 (×2): 40 mg via ORAL
  Filled 2014-04-30: qty 4

## 2014-04-30 MED ORDER — FENTANYL CITRATE 0.05 MG/ML IJ SOLN
INTRAMUSCULAR | Status: DC | PRN
  Administered 2014-04-30: 50 ug via INTRAVENOUS
  Administered 2014-04-30: 100 ug via INTRAVENOUS
  Administered 2014-04-30 (×2): 50 ug via INTRAVENOUS

## 2014-04-30 MED ORDER — AMLODIPINE BESYLATE 5 MG PO TABS
10.0000 mg | ORAL_TABLET | Freq: Every day | ORAL | Status: DC
  Administered 2014-04-30 – 2014-05-01 (×2): 10 mg via ORAL
  Filled 2014-04-30 (×2): qty 2

## 2014-04-30 MED ORDER — MIDAZOLAM HCL 2 MG/2ML IJ SOLN
1.0000 mg | INTRAMUSCULAR | Status: DC | PRN
  Administered 2014-04-30: 2 mg via INTRAVENOUS

## 2014-04-30 MED ORDER — POVIDONE-IODINE 10 % OINT PACKET
TOPICAL_OINTMENT | CUTANEOUS | Status: DC | PRN
  Administered 2014-04-30: 1 via TOPICAL

## 2014-04-30 MED ORDER — NEOSTIGMINE METHYLSULFATE 10 MG/10ML IV SOLN
INTRAVENOUS | Status: DC | PRN
Start: 2014-04-30 — End: 2014-04-30
  Administered 2014-04-30: 2 mg via INTRAVENOUS

## 2014-04-30 MED ORDER — ONDANSETRON HCL 4 MG/2ML IJ SOLN: 4.0000 mg | Freq: Once | INTRAMUSCULAR | Status: DC | PRN

## 2014-04-30 MED ORDER — FENTANYL CITRATE 0.05 MG/ML IJ SOLN
INTRAMUSCULAR | Status: AC
  Filled 2014-04-30: qty 5

## 2014-04-30 MED ORDER — DEXAMETHASONE SODIUM PHOSPHATE 4 MG/ML IJ SOLN
4.0000 mg | Freq: Once | INTRAMUSCULAR | Status: AC
  Administered 2014-04-30: 4 mg via INTRAVENOUS

## 2014-04-30 SURGICAL SUPPLY — 39 items
APPLIER CLIP LAPSCP 10X32 DD (CLIP) ×3 IMPLANT
BAG HAMPER (MISCELLANEOUS) ×3 IMPLANT
CLOTH BEACON ORANGE TIMEOUT ST (SAFETY) ×3 IMPLANT
COVER LIGHT HANDLE STERIS (MISCELLANEOUS) ×6 IMPLANT
DECANTER SPIKE VIAL GLASS SM (MISCELLANEOUS) ×3 IMPLANT
DRAPE C-ARM FOLDED MOBILE STRL (DRAPES) ×3 IMPLANT
DURAPREP 26ML APPLICATOR (WOUND CARE) ×3 IMPLANT
ELECT REM PT RETURN 9FT ADLT (ELECTROSURGICAL) ×3
ELECTRODE REM PT RTRN 9FT ADLT (ELECTROSURGICAL) ×1 IMPLANT
FILTER SMOKE EVAC LAPAROSHD (FILTER) ×3 IMPLANT
FORMALIN 10 PREFIL 120ML (MISCELLANEOUS) ×3 IMPLANT
GLOVE BIOGEL PI IND STRL 7.0 (GLOVE) ×4 IMPLANT
GLOVE BIOGEL PI INDICATOR 7.0 (GLOVE) ×8
GLOVE ECLIPSE 6.5 STRL STRAW (GLOVE) ×3 IMPLANT
GLOVE ECLIPSE 7.0 STRL STRAW (GLOVE) ×3 IMPLANT
GLOVE SURG SS PI 7.5 STRL IVOR (GLOVE) ×6 IMPLANT
GOWN STRL REUS W/TWL LRG LVL3 (GOWN DISPOSABLE) ×9 IMPLANT
HEMOSTAT SNOW SURGICEL 2X4 (HEMOSTASIS) ×3 IMPLANT
INST SET LAPROSCOPIC AP (KITS) ×3 IMPLANT
KIT ROOM TURNOVER APOR (KITS) ×3 IMPLANT
MANIFOLD NEPTUNE II (INSTRUMENTS) ×3 IMPLANT
NEEDLE INSUFFLATION 120MM (ENDOMECHANICALS) ×3 IMPLANT
NS IRRIG 1000ML POUR BTL (IV SOLUTION) ×3 IMPLANT
PACK LAP CHOLE LZT030E (CUSTOM PROCEDURE TRAY) ×3 IMPLANT
PAD ARMBOARD 7.5X6 YLW CONV (MISCELLANEOUS) ×3 IMPLANT
POUCH SPECIMEN RETRIEVAL 10MM (ENDOMECHANICALS) ×3 IMPLANT
SET BASIN LINEN APH (SET/KITS/TRAYS/PACK) ×3 IMPLANT
SLEEVE ENDOPATH XCEL 5M (ENDOMECHANICALS) ×3 IMPLANT
SPONGE GAUZE 2X2 8PLY STER LF (GAUZE/BANDAGES/DRESSINGS) ×1
SPONGE GAUZE 2X2 8PLY STRL LF (GAUZE/BANDAGES/DRESSINGS) ×2 IMPLANT
STAPLER VISISTAT (STAPLE) ×3 IMPLANT
SUT VICRYL 0 UR6 27IN ABS (SUTURE) ×3 IMPLANT
TAPE CLOTH SURG 4X10 WHT LF (GAUZE/BANDAGES/DRESSINGS) ×3 IMPLANT
TROCAR ENDO BLADELESS 11MM (ENDOMECHANICALS) ×3 IMPLANT
TROCAR XCEL NON-BLD 5MMX100MML (ENDOMECHANICALS) ×3 IMPLANT
TROCAR XCEL UNIV SLVE 11M 100M (ENDOMECHANICALS) ×3 IMPLANT
TUBING INSUFFLATION (TUBING) ×3 IMPLANT
WARMER LAPAROSCOPE (MISCELLANEOUS) ×3 IMPLANT
YANKAUER SUCT 12FT TUBE ARGYLE (SUCTIONS) ×3 IMPLANT

## 2014-04-30 NOTE — Anesthesia Preprocedure Evaluation (Signed)
Anesthesia Evaluation  Patient identified by MRN, date of birth, ID band Patient awake    Reviewed: Allergy & Precautions, H&P , NPO status , Patient's Chart, lab work & pertinent test results  Airway Mallampati: III TM Distance: >3 FB Neck ROM: Full    Dental  (+) Teeth Intact, Missing   Pulmonary neg pulmonary ROS,  breath sounds clear to auscultation        Cardiovascular hypertension, Pt. on medications negative cardio ROS  Rhythm:Regular Rate:Normal     Neuro/Psych    GI/Hepatic Gallstone pancreatitis    Endo/Other    Renal/GU      Musculoskeletal   Abdominal   Peds  Hematology   Anesthesia Other Findings   Reproductive/Obstetrics                           Anesthesia Physical Anesthesia Plan  ASA: II  Anesthesia Plan: General   Post-op Pain Management:    Induction: Intravenous  Airway Management Planned: Oral ETT  Additional Equipment:   Intra-op Plan:   Post-operative Plan: Extubation in OR  Informed Consent: I have reviewed the patients History and Physical, chart, labs and discussed the procedure including the risks, benefits and alternatives for the proposed anesthesia with the patient or authorized representative who has indicated his/her understanding and acceptance.     Plan Discussed with:   Anesthesia Plan Comments:         Anesthesia Quick Evaluation

## 2014-04-30 NOTE — Care Management Note (Signed)
    Page 1 of 1   04/30/2014     3:33:11 PM CARE MANAGEMENT NOTE 04/30/2014  Patient:  Caroline Maldonado, Caroline Maldonado   Account Number:  [id number]  Date Initiated:  04/30/2014  Documentation initiated by:  Kathyrn Sheriff  Subjective/Objective Assessment:   Pt is from home with self care. Pt speaks very little english. Pt to have lap chole today.     Action/Plan:   Pt plans to discharge home with self care. Pt will follow with surgeon. Pt has no CM needs at this time.   Anticipated DC Date:  05/02/2014   Anticipated DC Plan:  HOME/SELF CARE      DC Planning Services  CM consult      Choice offered to / List presented to:             Status of service:  Completed, signed off Medicare Important Message given?   (If response is "NO", the following Medicare IM given date fields will be blank) Date Medicare IM given:   Medicare IM given by:   Date Additional Medicare IM given:   Additional Medicare IM given by:    Discharge Disposition:  HOME/SELF CARE  Per UR Regulation:    If discussed at Long Length of Stay Meetings, dates discussed:    Comments:  04/30/2014 1500 Kathyrn Sheriff, RN, MSN, [facility]

## 2014-04-30 NOTE — Progress Notes (Signed)
Patient would not wake enough to instruct on use of IS. RT told family members she be given instructions later

## 2014-04-30 NOTE — Progress Notes (Signed)
[facility] PROGRESS NOTE  Caroline Maldonado NWG:[id number] DOB: 1934/04/02 DOA: 04/27/2014 PCP: Default, Provider, MD  Assessment/Plan: 1. Acute gallstone pancreatitis. Lipase and liver enzymes appear to be improving. Clinically, her abdominal pain and vomiting is also improving. She's been seen by general surgery and underwent laparoscopic cholecystectomy with intraoperative cholangiogram on 8/28. She's continued on Unasyn for now. 2. Cholecystitis. Status post cholecystectomy. General surgery: 3. Hypertension. She is on multiple medications as an outpatient which have been held since admission. Blood pressure is now trending up. We'll restart lisinopril. 4. Anemia. Likely related to her acute illness. No evidence of bleeding. Continue to follow. Hemoglobin stable  Code Status: full code Family Communication: discussed with patient and family interpreted Disposition Plan: discharge home once improved, possibly tomorrow   Consultants:  General surgery  Procedures:  Laparoscopic cholecystectomy on 8/28  Antibiotics:  unasyn 8/26  HPI/Subjective: Seen postoperatively in her room. She is still somnolent from anesthesia. Denies any complaints  Objective: Filed Vitals:   04/30/14 1445  BP: 154/52  Pulse: 52  Temp: 97.8 F (36.6 C)  Resp: 16    Intake/Output Summary (Last 24 hours) at 04/30/14 1916 Last data filed at 04/30/14 1800  Gross per 24 hour  Intake    720 ml  Output      0 ml  Net    720 ml   Filed Weights   04/28/14 0422 04/29/14 0504 04/30/14 0500  Weight: 72.4 kg (159 lb 9.8 oz) 70.1 kg (154 lb 8.7 oz) 69.1 kg (152 lb 5.4 oz)    Exam:   General:  NAD  Cardiovascular: S1, S2 RRR  Respiratory: CTA B  Abdomen: soft, nt, nd, bs+  Musculoskeletal: no edema b/l   Data Reviewed: Basic Metabolic Panel:  Recent Labs Lab 04/28/14 0001 04/28/14 0501 04/29/14 0552 04/30/14 0630  NA 141 142 142 142  K 3.8 3.6* 4.0 4.0  CL 99 102 106 103  CO2  GLUCOSE 182* 162* 111* 112*  BUN 27* 28* 11 6  CREATININE 0.85 1.08 0.68 0.63  CALCIUM 9.2 8.7 8.3* 8.8   Liver Function Tests:  Recent Labs Lab 04/28/14 0001 04/28/14 0501 04/29/14 0552 04/30/14 0630  AST 622* 525* 150* 65*  ALT 369* 375* 211* 144*  ALKPHOS 209* 195* 150* 139*  BILITOT 1.3* 1.8* 0.5 0.3  PROT 8.2 7.2 6.4 7.0  ALBUMIN 4.0 3.6 3.0* 3.2*    Recent Labs Lab 04/28/14 0001 04/28/14 0501 04/29/14 0552 04/30/14 0630  LIPASE >3000* 895* 81* 50   No results found for this basename: AMMONIA,  in the last 168 hours CBC:  Recent Labs Lab 04/28/14 0001 04/28/14 0501 04/29/14 0552 04/30/14 0630  WBC 9.1 19.6* 10.1 8.1  NEUTROABS 8.7*  --   --   --   HGB 12.2 11.0* 9.8* 10.5*  HCT 36.9 33.7* 30.8* 32.7*  MCV 80.9 80.8 82.1 82.6  PLT 188 206 163 177   Cardiac Enzymes: No results found for this basename: CKTOTAL, CKMB, CKMBINDEX, TROPONINI,  in the last 168 hours BNP (last 3 results) No results found for this basename: PROBNP,  in the last 8760 hours CBG: No results found for this basename: GLUCAP,  in the last 168 hours  Recent Results (from the past 240 hour(s))  SURGICAL PCR SCREEN     Status: None   Collection Time    04/29/14  3:25 PM      Result Value Ref Range Status   MRSA, PCR NEGATIVE  NEGATIVE Final   Staphylococcus aureus NEGATIVE  NEGATIVE Final   Comment:            The Xpert SA Assay (FDA     approved for NASAL specimens     in patients over 51 years of age),     is one component of     a comprehensive surveillance     program.  Test performance has     been validated by [employer] for patients greater     than or equal to 80 year old.     It is not intended     to diagnose infection nor to     guide or monitor treatment.     Studies: No results found.  Scheduled Meds: . amLODipine  10 mg Oral Daily  . ampicillin-sulbactam (UNASYN) IV  1.5 g Intravenous Q6H  . enoxaparin (LOVENOX) injection  40 mg  Subcutaneous Q24H  . lisinopril  40 mg Oral Daily   Continuous Infusions: . lactated ringers 75 mL/hr at 04/30/14 1429    Principal Problem:   Acute gallstone pancreatitis Active Problems:   Hypertension   Hyperlipidemia   Abdominal pain, epigastric   Nausea and vomiting    Time spent:    [facility]  [facility] Pager [phone]. If 7PM-7AM, please contact night-coverage at [email or web address], password [facility] 04/30/2014, 7:16 PM  LOS: 3 days

## 2014-04-30 NOTE — Anesthesia Postprocedure Evaluation (Signed)
  Anesthesia Post-op Note  Patient: Caroline Maldonado  Procedure(s) Performed: Procedure(s): LAPAROSCOPIC CHOLECYSTECTOMY (N/A)  Patient Location: PACU  Anesthesia Type:General  Level of Consciousness: awake, alert , oriented and patient cooperative  Airway and Oxygen Therapy: Patient Spontanous Breathing  Post-op Pain: 3 /10, mild  Post-op Assessment: Post-op Vital signs reviewed, Patient's Cardiovascular Status Stable, Respiratory Function Stable, Patent Airway and Pain level controlled  Post-op Vital Signs: Reviewed and stable  Last Vitals:  Filed Vitals:   04/30/14 1130  BP: 148/70  Pulse:   Temp:   Resp: 16    Complications: No apparent anesthesia complications

## 2014-04-30 NOTE — Transfer of Care (Signed)
Immediate Anesthesia Transfer of Care Note  Patient: Caroline Maldonado  Procedure(s) Performed: Procedure(s): LAPAROSCOPIC CHOLECYSTECTOMY (N/A)  Patient Location: PACU  Anesthesia Type:General  Level of Consciousness: awake and patient cooperative  Airway & Oxygen Therapy: Patient Spontanous Breathing  Post-op Assessment: Report given to PACU RN  Post vital signs: Reviewed and stable  Complications: No apparent anesthesia complications

## 2014-04-30 NOTE — Op Note (Signed)
Patient:  Caroline Maldonado  DOB:  1934-07-20  MRN:  [id number]   Preop Diagnosis:  Cholecystitis, cholelithiasis, pancreatitis  Postop Diagnosis:  Same  Procedure:  Laparoscopic cholecystectomy  Surgeon:  Franky Macho, M.D.  Anes:  General endotracheal  Indications:  Patient is a 80 year old Hispanic female who presents with right upper quadrant abdominal pain and nausea. She was found on workup to have gallstone pancreatitis. The risks and benefits of the procedure including bleeding, infection, hepatobiliary, and the possibility of an open procedure were fully explained to the patient through an interpreter, who gave informed consent.  Procedure note:  The patient is placed the supine position. After induction of general endotracheal anesthesia, the abdomen was prepped and draped using usual sterile technique with DuraPrep. Surgical site confirmation was performed.  A supraumbilical incision was made down to the fascia. A Veress needle was introduced into the abdominal cavity and confirmation of placement was done using the saline drop test. The abdomen was then insufflated to 16 mm mercury pressure. An 11 mm trocar was introduced the abdominal cavity under direct visualization without difficulty. The patient was placed in reverse Trendelenburg position and additional 11 mm trocar was placed the epigastric region and 5 mm trochars were placed the right upper quadrant and right flank regions. The liver was inspected and noted within normal limits. The gallbladder was retracted in a dynamic fashion in order to expose the triangle of Calot. The cystic duct was first identified. Its juncture to the infundibulum was fully identified. Endoclips placed proximally and distally on the cystic duct, and the cystic duct was divided. This was likewise done the cystic artery. The gallbladder was then freed away from the gallbladder fossa using Bovie electrocautery. The gallbladder was delivered to the  epigastric trocar site using an Endo Catch bag. The gallbladder fossa was inspected and no abnormal bleeding or bile leakage was noted. Surgicel is placed the gallbladder fossa. All fluid and air were then evacuated from the abdominal cavity prior to removal of the trochars.  All wounds were irrigated with normal saline. All wounds were checked with 0.5% Sensorcaine. The supraumbilical fascia as well as epigastric fascia were reapproximated using 0 Vicryl interrupted sutures. All skin incisions were closed using staples. Betadine ointment and dry sterile dressings were applied.  All tape and needle counts were correct at the end of the procedure. Patient was extubated in the operating room and transferred to PACU in stable condition.  Complications:  None  EBL:  Minimal  Specimen:  Gallbladder

## 2014-04-30 NOTE — Anesthesia Procedure Notes (Signed)
Procedure Name: Intubation Date/Time: 04/30/2014 11:43 AM Performed by: Despina Hidden Pre-anesthesia Checklist: Emergency Drugs available, Suction available, Patient being monitored and Patient identified Patient Re-evaluated:Patient Re-evaluated prior to inductionOxygen Delivery Method: Circle system utilized Preoxygenation: Pre-oxygenation with 100% oxygen Intubation Type: IV induction Ventilation: Mask ventilation without difficulty and Oral airway inserted - appropriate to patient size Laryngoscope Size: Mac and 3 Grade View: Grade I Tube type: Oral Tube size: 7.0 mm Number of attempts: 1 Airway Equipment and Method: Stylet Placement Confirmation: ETT inserted through vocal cords under direct vision,  positive ETCO2 and breath sounds checked- equal and bilateral Secured at: 22 cm Tube secured with: Tape Dental Injury: Teeth and Oropharynx as per pre-operative assessment

## 2014-05-01 LAB — CBC
HEMATOCRIT: 30.3 % — AB (ref 36.0–46.0)
Hemoglobin: 9.7 g/dL — ABNORMAL LOW (ref 12.0–15.0)
MCH: 26.1 pg (ref 26.0–34.0)
MCHC: 32 g/dL (ref 30.0–36.0)
MCV: 81.5 fL (ref 78.0–100.0)
Platelets: 178 10*3/uL (ref 150–400)
RBC: 3.72 MIL/uL — ABNORMAL LOW (ref 3.87–5.11)
RDW: 14.6 % (ref 11.5–15.5)
WBC: 10.4 10*3/uL (ref 4.0–10.5)

## 2014-05-01 LAB — HEPATIC FUNCTION PANEL
ALT: 132 U/L — AB (ref 0–35)
AST: 92 U/L — AB (ref 0–37)
Albumin: 3 g/dL — ABNORMAL LOW (ref 3.5–5.2)
Alkaline Phosphatase: 109 U/L (ref 39–117)
BILIRUBIN TOTAL: 0.4 mg/dL (ref 0.3–1.2)
Bilirubin, Direct: <0.2 mg/dL (ref 0.0–0.3)
Total Protein: 6.8 g/dL (ref 6.0–8.3)

## 2014-05-01 LAB — BASIC METABOLIC PANEL
Anion gap: 12 (ref 5–15)
BUN: 13 mg/dL (ref 6–23)
CO2: 28 mEq/L (ref 19–32)
CREATININE: 0.86 mg/dL (ref 0.50–1.10)
Calcium: 8.7 mg/dL (ref 8.4–10.5)
Chloride: 99 mEq/L (ref 96–112)
GFR calc Af Amer: 74 mL/min — ABNORMAL LOW (ref >90)
GFR calc non Af Amer: 64 mL/min — ABNORMAL LOW (ref >90)
Glucose, Bld: 139 mg/dL — ABNORMAL HIGH (ref 70–99)
Potassium: 4.2 mEq/L (ref 3.7–5.3)
Sodium: 139 mEq/L (ref 137–147)

## 2014-05-01 MED ORDER — TRAMADOL HCL 50 MG PO TABS: 50.0000 mg | ORAL_TABLET | Freq: Four times a day (QID) | ORAL | Status: AC | PRN

## 2014-05-01 NOTE — Discharge Summary (Signed)
Physician Discharge Summary  Patient ID: Caroline Maldonado MRN: [id number] DOB/AGE: 78-Feb-1935 78 y.o.  Admit date: 04/27/2014 Discharge date: 05/01/2014  Admission Diagnoses: Cholecystitis, cholelithiasis, pancreatitis  Discharge Diagnoses: Same Principal Problem:   Acute gallstone pancreatitis Active Problems:   Hypertension   Hyperlipidemia   Abdominal pain, epigastric   Nausea and vomiting   Discharged Condition: good  Hospital Course: Patient is a 80 year old Hispanic female who presented to the emergency room with worsening right upper quadrant abdominal pain and nausea. She was found on workup to have cholecystitis, cholelithiasis, gallstone pancreatitis. She was admitted to hospital for further evaluation treatment. Her liver enzyme tests and pancreatic enzymes improved while in the hospital. She was placed on IV antibiotics and pain medication. She subsequently laparoscopic cholecystectomy on 04/30/2014. I was going to do a cholangiogram, but her cystic duct was small. She tolerated procedure well. Postoperative course has been unremarkable. Her diet was advanced without difficulty. She is being discharged home in good improving condition.  Treatments: surgery: Laparoscopic cholecystectomy on 04/30/2014  Discharge Exam: Blood pressure 105/54, pulse 54, temperature 98.3 F (36.8 C), temperature source Oral, resp. rate 20, height  (1.651 m), weight 72.5 kg (159 lb 13.3 oz), SpO2 96.00%. General appearance: alert, cooperative and no distress Resp: clear to auscultation bilaterally Cardio: regular rate and rhythm, S1, S2 normal, no murmur, click, rub or gallop GI: Soft. Dressings dry and intact.  Disposition: Home    Medication List         acetaminophen 500 MG tablet  Commonly known as:  TYLENOL  Take 500 mg by mouth every 6 (six) hours as needed for mild pain or moderate pain.     amLODipine 10 MG tablet  Commonly known as:  NORVASC  Take 10 mg by mouth daily.     cholecalciferol 400 UNITS Tabs tablet  Commonly known as:  VITAMIN D  Take 400 Units by mouth daily.     clotrimazole 1 % cream  Commonly known as:  LOTRIMIN  Apply 1 application topically 2 (two) times daily.     furosemide 40 MG tablet  Commonly known as:  LASIX  Take 40 mg by mouth daily.     hydrocortisone 2.5 % cream  Apply 1 application topically 2 (two) times daily.     hydrOXYzine 10 MG tablet  Commonly known as:  ATARAX/VISTARIL  Take 10 mg by mouth 3 (three) times daily as needed.     lisinopril 40 MG tablet  Commonly known as:  PRINIVIL,ZESTRIL  Take 40 mg by mouth daily.     meloxicam 7.5 MG tablet  Commonly known as:  MOBIC  Take 7.5 mg by mouth every 12 (twelve) hours.     miconazole 2 % cream  Commonly known as:  MICOTIN  Apply 1 application topically 2 (two) times daily.     pantoprazole 40 MG tablet  Commonly known as:  PROTONIX  Take 40 mg by mouth daily.     traMADol 50 MG tablet  Commonly known as:  ULTRAM  Take 1 tablet (50 mg total) by mouth every 6 (six) hours as needed for moderate pain.           Follow-up Information   Follow up with Dalia Heading, MD. Schedule an appointment as soon as possible for a visit on 05/11/2014.   Specialty:  General Surgery   Contact information:   [address] [place] [address] [phone]       Signed: Franky Macho A 05/01/2014, 9:57 AM

## 2014-05-01 NOTE — Discharge Instructions (Signed)
Colecistectoma laparoscpica - Cuidados posteriores (Laparoscopic Cholecystectomy, Care After) Siga estas instrucciones durante las prximas semanas. Estas indicaciones le proporcionan informacin general acerca de cmo deber cuidarse despus del procedimiento. El mdico tambin podr darle instrucciones ms especficas. El tratamiento ha sido planificado segn las prcticas mdicas actuales, pero en algunos casos pueden ocurrir problemas. Comunquese con el mdico si tiene algn problema o tiene dudas despus del procedimiento. QU ESPERAR DESPUS DEL PROCEDIMIENTO Despus del procedimiento, es comn tener las siguientes sensaciones:  Dolor en los lugares de la incisin. Le darn analgsicos para Human resources officer.  Nuseas o vmitos leves. Estos sntomas deberan mejorar despus de las primeras 24horas.  Meteorismo y Designer, fashion/clothing en el hombro debido al gas que se [place] durante el procedimiento. Estos sntomas mejorarn despus de las primeras 24horas. INSTRUCCIONES PARA EL CUIDADO EN EL HOGAR   Cambie los apsitos (vendajes) tal como le indic el mdico.  Mantenga la herida limpia y seca. Puede lavar la herida suavemente con agua y [place]. Seque dando palmaditas suaves.  No se bae en la baera, no practique natacin ni use el jacuzzy durante 2semanas o hasta que lo autorice el mdico.  Callery solo medicamentos de venta libre o recetados, segn las indicaciones del mdico.  Siga su dieta normal segn las indicaciones de su mdico.  No levante ningn objeto que pese ms de 10libras (4,5kg) hasta que el mdico lo autorice.  No practique deportes de contacto durante 1semana o hasta que el mdico lo autorice. SOLICITE ATENCIN MDICA SI:   Presenta enrojecimiento, hinchazn o aumento del dolor en la herida.  Observa una secrecin de color blanco amarillento (pus) en la herida.  Hay una secrecin en la herida que dura ms de 1da.  Advierte un olor ftido que proviene de la  herida o del vendaje.  Los cortes quirrgicos (incisiones) se abren. SOLICITE ATENCIN MDICA DE INMEDIATO SI:   Le aparece una erupcin cutnea.  Tiene dificultad para respirar.  Siente dolor en el pecho.  Tiene fiebre.  Nota un incremento del dolor en los hombros (en la zona donde van los breteles).  Presenta episodios de mareos o se siente dbil cuando est de pie.  Siente un dolor abdominal intenso.  Tiene Programme researcher, broadcasting/film/video (nuseas) o vomita y esto dura ms de 1da. Document Released: 04/02/2011 Document Revised: 06/10/2013 [facility] Patient Information 2015 [place], [place]. This information is not intended to replace advice given to you by your health care provider. Make sure you discuss any questions you have with your health care provider.  Pancreatitis aguda  (Acute Pancreatitis)  La pancreatitis aguda es una enfermedad en la que el pncreas se irrita (inflama) de manera sbita. El pncreas es una glndula grande ubicada detrs del [place]. Produce enzimas que ayudan a digerir los alimentos. Tambin produce 2 tipos de hormonas que controlan el nivel de Banker. La pancreatitis aguda se produce cuando las enzimas atacan y daan el pncreas. La mayor parte de los ataques duran un par de [address]  [phone] y pueden ocasionar problemas graves. CUIDADOS EN EL HOGAR   Siga las instrucciones de su mdico sobre la dieta. Es necesario que evite el consumo de alcohol y grasas en la dieta.  Consuma pequeas cantidades de comida con ms frecuencia.  Beba gran cantidad de lquido para mantener el pis (orina) de tono claro o amarillo plido.  Tome slo los medicamentos que le haya indicado el mdico.  Evite el consumo de alcohol si esta fue la causa de su enfermedad.  No  fume.  Haga reposo.  Controle su nivel de Production assistant, radio en su casa tal como le indic el mdico.  Cumpla con los controles mdicos segn las indicaciones. SOLICITE AYUDA SI:  No mejora en el tiempo en que  se esperaba.  Tiene sntomas nuevos o graves.  Tiene dolor persistente, debilidad o siente ganas de vomitar (nuseas).  Comienza a mejorar y sufre un nuevo ataque de Engineer, mining. SOLICITE AYUDA DE INMEDIATO SI:   No puede comer o retener los lquidos.  El dolor se hace ms intenso.  Tiene fiebre o sntomas que persisten durante ms de 2 o 3 das.  Tiene fiebre y los sntomas empeoran bruscamente.  Nota que la piel o la zona blanca del ojo estn amarillas (ictericia).  Vomita.  Se siente mareado o se desvanece (se desmaya).  Su nivel de azcar en sangre es alto (ms de 300 mg/dL). ASEGRESE DE QUE:   Comprende estas instrucciones.  Controlar su enfermedad.  Solicitar ayuda de inmediato si no mejora o si empeora. Document Released: 02/19/2012 Document Revised: 01/04/2014 [facility] Patient Information 2015 [place], [place]. This information is not intended to replace advice given to you by your health care provider. Make sure you discuss any questions you have with your health care provider.

## 2014-05-01 NOTE — Progress Notes (Signed)
NURSING PROGRESS NOTE  Caroline Maldonado [id number] Discharge Data: 05/01/2014 12:18 PM Attending Provider: No att. providers found [id number], Provider, MD   Joanell Rising to be D/C'd Home per MD order.    All IV's will be discontinued and monitored for bleeding.  All belongings will be returned to patient for patient to take home.  AVS Summary reviewed with patient's son, whom patient lives with.   Patient left floor via wheelchair, escorted by NT.   Last Documented Vital Signs:  Blood pressure 105/54, pulse 54, temperature 98.3 F (36.8 C), temperature source Oral, resp. rate 20, height  (1.651 m), weight 72.5 kg (159 lb 13.3 oz), SpO2 95.00%.  Mertha Baars D

## 2014-05-02 NOTE — Anesthesia Postprocedure Evaluation (Signed)
  Anesthesia Post-op Note  Patient: Caroline Maldonado  Procedure(s) Performed: Procedure(s): LAPAROSCOPIC CHOLECYSTECTOMY (N/A)  Patient Location: room 340  Anesthesia Type:General  Level of Consciousness: awake, alert , oriented and patient cooperative  Airway and Oxygen Therapy: Patient Spontanous Breathing  Post-op Pain: 2 /10, mild  Post-op Assessment: Post-op Vital signs reviewed, Patient's Cardiovascular Status Stable, Respiratory Function Stable, Patent Airway, No signs of Nausea or vomiting, Adequate PO intake and Pain level controlled  Post-op Vital Signs: Reviewed and stable  Last Vitals:  Filed Vitals:   05/01/14 0545  BP: 105/54  Pulse: 54  Temp: 36.8 C  Resp: 20    Complications: No apparent anesthesia complications

## 2014-05-02 NOTE — Addendum Note (Signed)
Addendum created 05/02/14 1453 by Despina Hidden, CRNA   Modules edited: Notes Section   Notes Section:  File: [id number]

## 2015-03-05 IMAGING — CT CT ABDOMEN AND PELVIS WITHOUT AND WITH CONTRAST
2 of 10 series · 11 of 46 positions shown, 18 images · IV contrast (isovue)
Comparison: None.

CLINICAL DATA: Hematuria.  Pain.

EXAM:
CT ABDOMEN AND PELVIS WITHOUT AND WITH CONTRAST
TECHNIQUE: Multidetector CT imaging of the abdomen and pelvis was performed
without contrast material in one or both body regions, followed by
contrast material(s) and further sections in one or both body
regions.
CONTRAST:  100 cc Isovue 370.

[Series 6: cor hematuria > 45 post · coronal · 0.76mm/px · 2 of 134 slices shown, 3 images]
[im 45/134  soft-tissue]
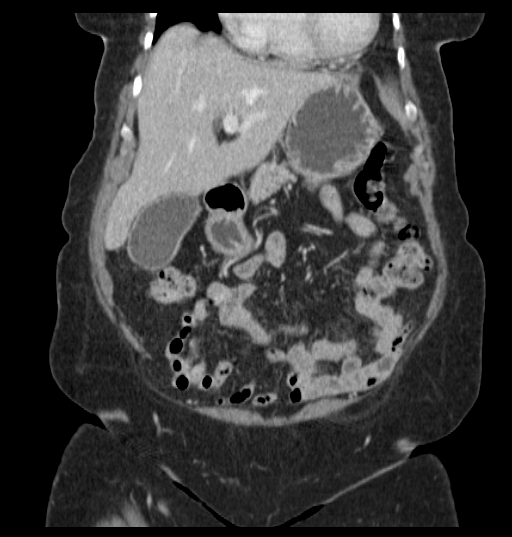
[im 45/134  bone]
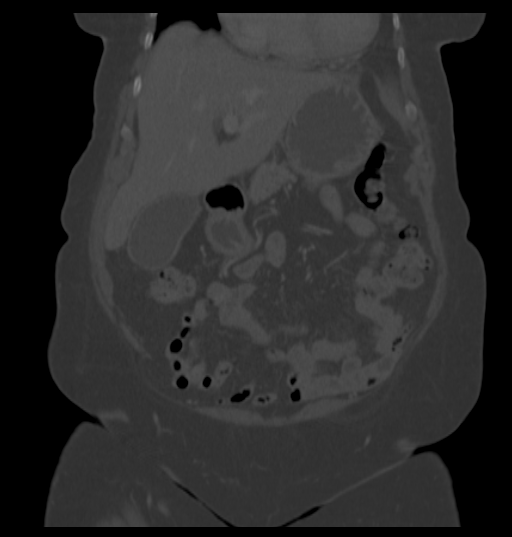
[im 89/134  soft-tissue]
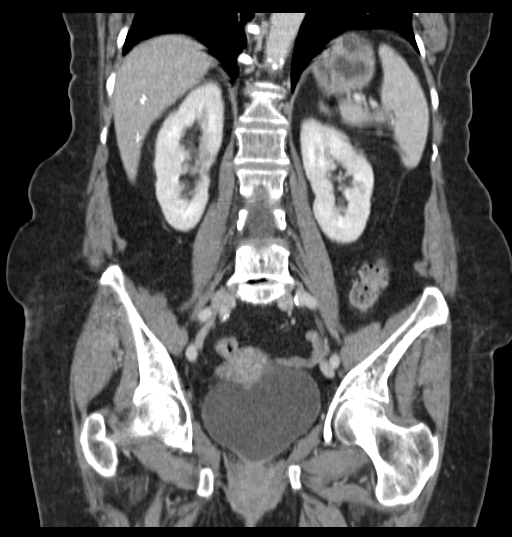

[Series 12: hematuria > 45 delay · axial · delayed · 0.78mm/px · z∈[-978,-632]mm · 9 of 87 slices shown, 15 images]
[im 9/87  soft-tissue]
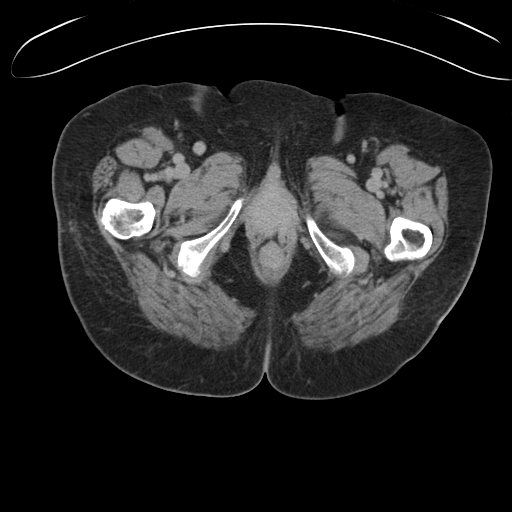
[im 9/87  bone]
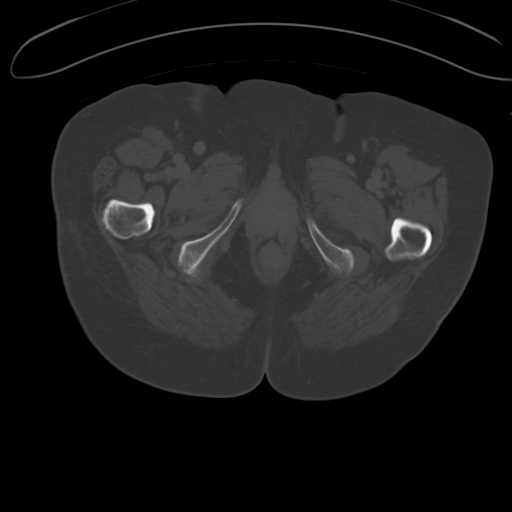
[im 18/87  soft-tissue]
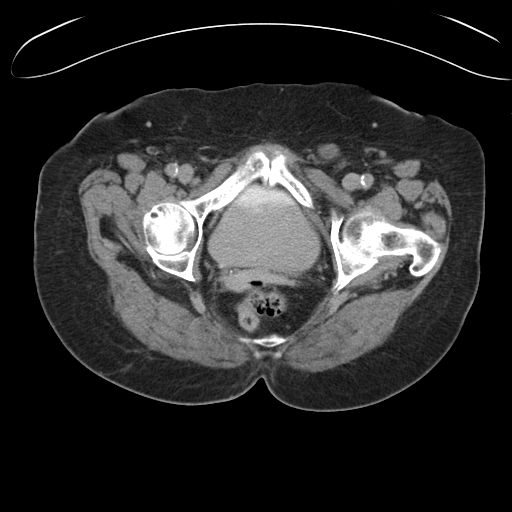
[im 26/87  soft-tissue]
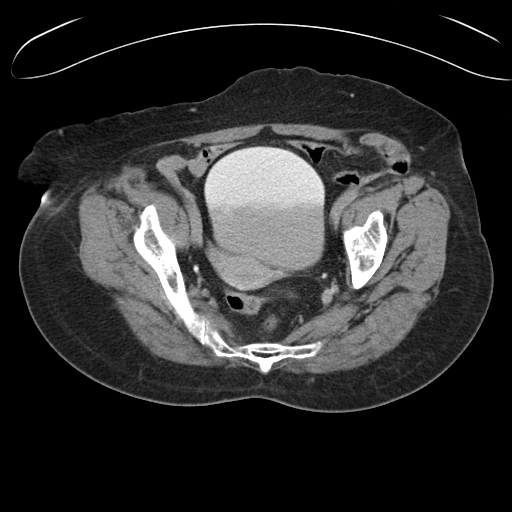
[im 35/87  soft-tissue]
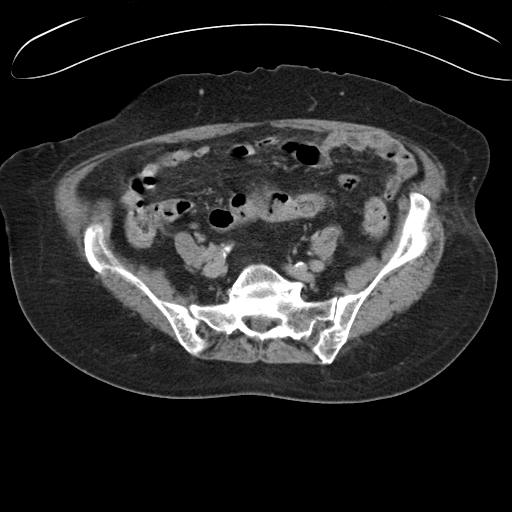
[im 44/87  soft-tissue]
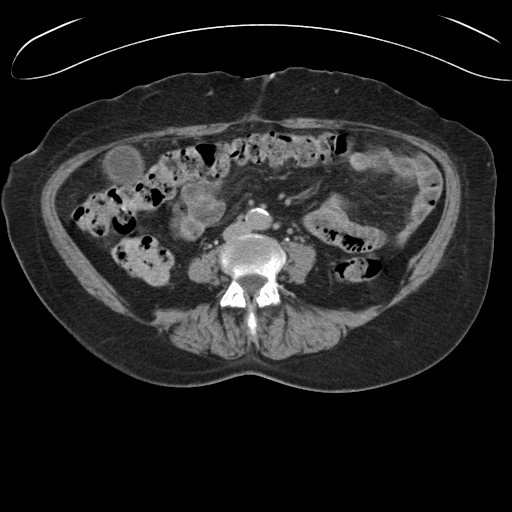
[im 52/87  soft-tissue]
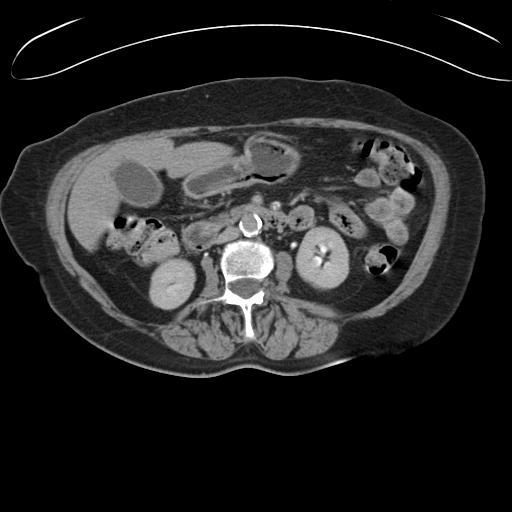
[im 52/87  lung]
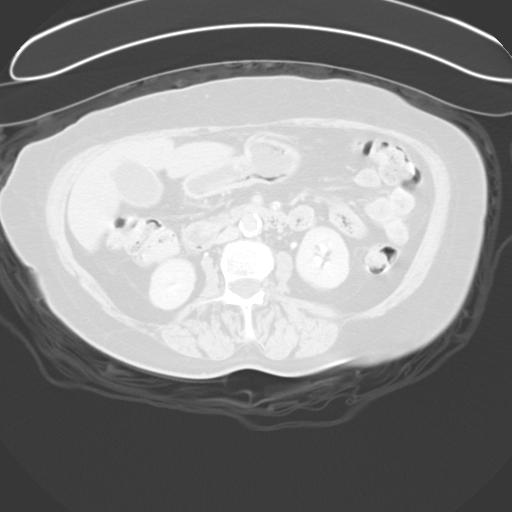
[im 61/87  soft-tissue]
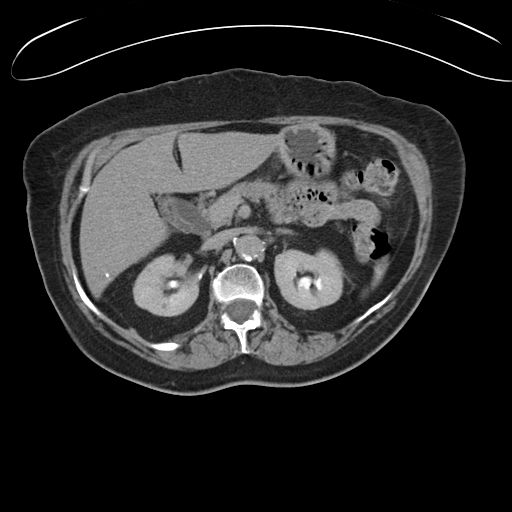
[im 61/87  lung]
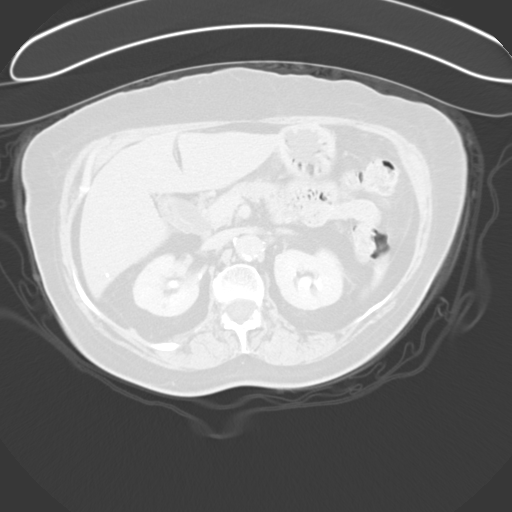
[im 69/87  soft-tissue]
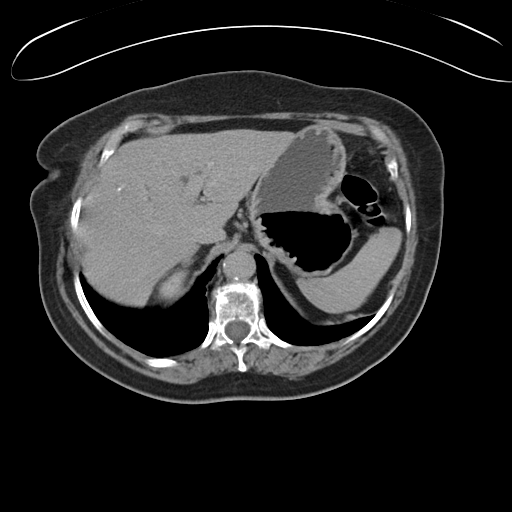
[im 69/87  lung]
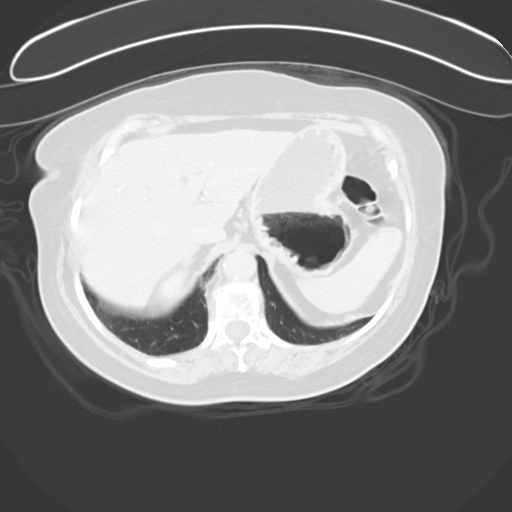
[im 78/87  soft-tissue]
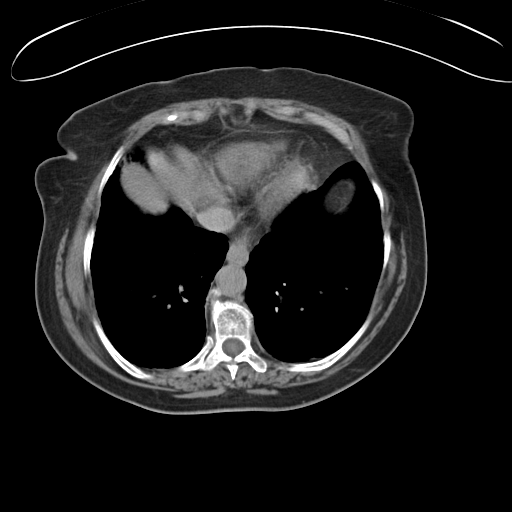
[im 78/87  lung]
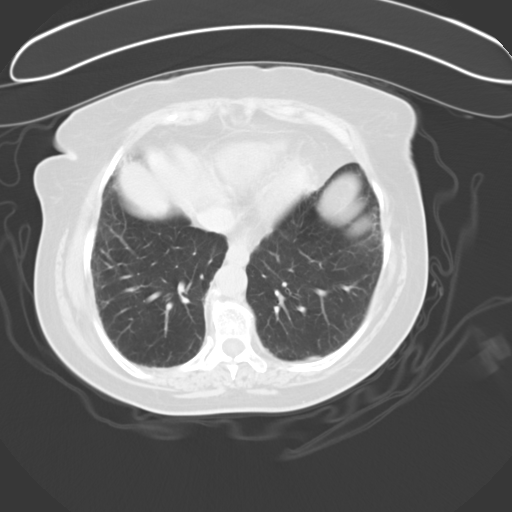
[im 78/87  bone]
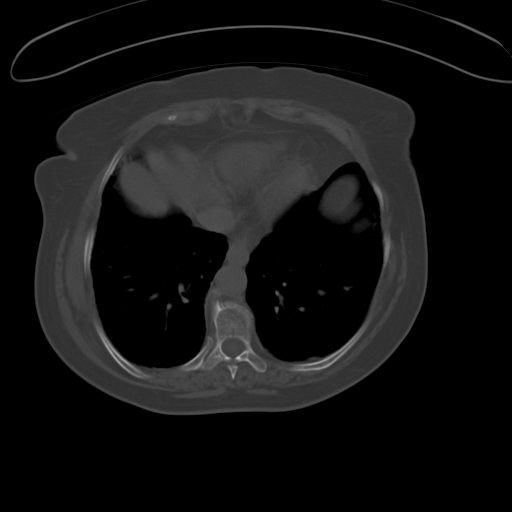

[11 of 46 positions shown; findings below may reference images not displayed]

FINDINGS: Punctate calcifications noted liver consistent with calcified
granulomas. Spleen normal. Pancreas normal. Gallbladder is
nondistended. Common bile duct diameter is 11 mm. This is slightly
distended. Further evaluation with MRCP can be obtained.

Adrenals are normal. Tiny punctate hyperdense and low-density foci
are noted in the kidneys, too small for accurate CT evaluation, most
likely hyperdense and tiny simple renal cysts. Given the patient's
history of hematuria it may be wise to perform follow-up CT in 6
months. A 12 mm simple cyst in the medial portion of the right
kidney . The bladder is unremarkable. Uterus and adnexa are
unremarkable. No free pelvic fluid. Calcification of the pelvis
consistent with phleboliths.

No significant adenopathy is noted. Aortoiliac and visceral vascular
calcification is present. Abdominal aorta is normal in caliber .
Renal artery vascular calcifications present.

Appendix is normal. No inflammatory change in right or left lower
quadrant. No bowel obstruction noted. No free air. No mesenteric
abnormalities identified. Tiny hiatal hernia is present. No gastric
distention.

Heart size normal. Mild atelectasis lung bases. Degenerative changes
lumbar spine and both hips. Grade 2 spondylolisthesis L4 on L5. Tiny
umbilical hernia herniation of fat only.
IMPRESSION: 1. Common bile duct distention to 11 mm. Further evaluation with
MRCP can be obtained. No focal obstructing lesion is identified by
CT. No evidence choledocholithiasis or pancreatic mass.
2. Multiple hyperdense and low density foci in the kidneys , most
consistent with tiny hyperdense and simple renal cysts. Given the
patient's history of hematuria, follow-up CT in 6 months to
demonstrate stability should be considered .
3. Aortoiliac, visceral including renal atherosclerotic vascular
disease.
4. Grade 2 spondylolisthesis L4 on L5.

## 2023-05-02 ENCOUNTER — Other Ambulatory Visit: Payer: Self-pay | Admitting: Physician Assistant

## 2023-05-02 DIAGNOSIS — M79661 Pain in right lower leg: Secondary | ICD-10-CM

## 2023-05-07 ENCOUNTER — Ambulatory Visit
Admission: RE | Admit: 2023-05-07 | Discharge: 2023-05-07 | Disposition: A | Discharge status: Home / Self Care | Payer: Self-pay | Source: Ambulatory Visit | Attending: Physician Assistant | Admitting: Physician Assistant

## 2023-05-07 DIAGNOSIS — M79661 Pain in right lower leg: Secondary | ICD-10-CM | POA: Insufficient documentation
